# Patient Record
Sex: Female | Born: 1965 | Race: Black or African American | Hispanic: No | Marital: Single
Health system: Southern US, Community
[De-identification: ages and names within clinical notes are randomized; demographics above are authoritative.]

## PROBLEM LIST (undated history)

## (undated) DIAGNOSIS — N950 Postmenopausal bleeding: Secondary | ICD-10-CM

## (undated) DIAGNOSIS — Z973 Presence of spectacles and contact lenses: Secondary | ICD-10-CM

## (undated) DIAGNOSIS — N8500 Endometrial hyperplasia, unspecified: Secondary | ICD-10-CM

## (undated) DIAGNOSIS — M1990 Unspecified osteoarthritis, unspecified site: Secondary | ICD-10-CM

## (undated) DIAGNOSIS — I10 Essential (primary) hypertension: Secondary | ICD-10-CM

## (undated) HISTORY — PX: NO PAST SURGERIES: SHX2092

---

## 2019-07-20 ENCOUNTER — Ambulatory Visit: Payer: 59 | Attending: Internal Medicine

## 2019-07-20 DIAGNOSIS — Z23 Encounter for immunization: Secondary | ICD-10-CM

## 2019-07-20 NOTE — Progress Notes (Signed)
   Covid-19 Vaccination Clinic  Name:  Leah Hayes    MRN: [id number] DOB: [birth year]  07/20/2019  [name] was observed post Covid-19 immunization for 15 minutes without incident. She was provided with Vaccine Information Sheet and instruction to access the V-Safe system.   [name] was instructed to call 911 with any severe reactions post vaccine: Marland Kitchen Difficulty breathing  . Swelling of face and throat  . A fast heartbeat  . A bad rash all over body  . Dizziness and weakness   Immunizations Administered    Name Date Dose VIS Date Route   Pfizer COVID-19 Vaccine 07/20/2019  6:42 PM 0.3 mL 04/27/2019 Intramuscular   Manufacturer: [place]   Lot: [id number]   [place]: [id number]

## 2019-08-15 ENCOUNTER — Ambulatory Visit: Payer: 59 | Attending: Internal Medicine

## 2019-08-15 DIAGNOSIS — Z23 Encounter for immunization: Secondary | ICD-10-CM

## 2019-08-15 NOTE — Progress Notes (Signed)
   Covid-19 Vaccination Clinic  Name:  Leah Hayes    MRN: [id number] DOB: 05-Mar-1966  08/15/2019  [name] was observed post Covid-19 immunization for 15 minutes without incident. She was provided with Vaccine Information Sheet and instruction to access the V-Safe system.   [name] was instructed to call 911 with any severe reactions post vaccine: Marland Kitchen Difficulty breathing  . Swelling of face and throat  . A fast heartbeat  . A bad rash all over body  . Dizziness and weakness   Immunizations Administered    Name Date Dose VIS Date Route   Pfizer COVID-19 Vaccine 08/15/2019  9:13 AM 0.3 mL 04/27/2019 Intramuscular   Manufacturer: [place]   Lot: [id number]   [place]: [id number]

## 2019-08-20 ENCOUNTER — Ambulatory Visit: Payer: 59

## 2019-09-18 ENCOUNTER — Other Ambulatory Visit: Payer: Self-pay | Admitting: Family Medicine

## 2019-09-18 DIAGNOSIS — Z1231 Encounter for screening mammogram for malignant neoplasm of breast: Secondary | ICD-10-CM

## 2019-09-28 ENCOUNTER — Other Ambulatory Visit: Payer: Self-pay

## 2019-09-28 ENCOUNTER — Ambulatory Visit
Admission: RE | Admit: 2019-09-28 | Discharge: 2019-09-28 | Disposition: A | Discharge status: Home / Self Care | Payer: 59 | Source: Ambulatory Visit | Attending: Family Medicine | Admitting: Family Medicine

## 2019-09-28 DIAGNOSIS — Z1231 Encounter for screening mammogram for malignant neoplasm of breast: Secondary | ICD-10-CM

## 2019-10-05 ENCOUNTER — Other Ambulatory Visit: Payer: Self-pay | Admitting: Family Medicine

## 2019-10-05 DIAGNOSIS — R928 Other abnormal and inconclusive findings on diagnostic imaging of breast: Secondary | ICD-10-CM

## 2019-10-09 ENCOUNTER — Other Ambulatory Visit: Payer: 59

## 2020-08-28 ENCOUNTER — Other Ambulatory Visit: Payer: Self-pay | Admitting: Family Medicine

## 2020-08-28 DIAGNOSIS — Z1231 Encounter for screening mammogram for malignant neoplasm of breast: Secondary | ICD-10-CM

## 2020-09-12 NOTE — H&P (Signed)
Leah Hayes is an 55 y.o. G0 who is admitted for Robotic Assisted Total Laparoscopic Hysterectomy with Bilateral Salpingo-oophorectomy for endometrial hyperplasia without atypia.  Patient had an episode of PMB (spotting with wiping) in September 2021. Menopause occurred at age 29. Since her initial evaluation in October, has had occasional dark brown spotting. After extensive discussion reviewing risks/benefits/alternatives to medical vs definitive surgical management, patient opted to definitively manage via hysterectomy. She agrees with bilateral oophorectomy as prophylaxis as well. She was started on PO progestins 06/2020 while awaiting surgery.  Work-up: EMB (03/20/2021): Endometrial hyperplasia without atypia Pelvic US (03/20/21): Uterus 8.02 x 4.64 x 4.90cm, Endometrial thickness 1.03cm, R ovary 2.57cm, L ovary 2.86cm. Anteverted uterus. Uterus difficult to clearly visualize and evaluate. Endometrium appears thickened - no blood flow noted. Cervix - simple cyst 2.6 x 2.4 x 2.1cm, questionable Nabothian cyst. Bilateral ovaries wnl. Bowel gas noted throughout pelvis. Scanned both transvaginally and transabdominally - left ovary only visualized transabdominally. Pap (2018):  Normal, previous pap smears in the last 20 years normal  Patient Active Problem List   Diagnosis Date Noted  . Endometrial hyperplasia without atypia 09/15/2020  . Hypertension 09/15/2020  . Postmenopausal bleeding 09/15/2020  . Morbid obesity with BMI of 40.0-44.9, adult ([place]) 09/15/2020    MEDICAL/FAMILY/SOCIAL HX: No LMP recorded.    Past Medical History:  Diagnosis Date  . Endometrial hyperplasia without atypia   . Hypertension   . Morbid obesity with BMI of 40.0-44.9, adult [facility])     History reviewed. No pertinent surgical history.  No family history on file.  Social History:  has no history on file for tobacco use, alcohol use, and drug use.  ALLERGIES/MEDS:  Allergies: Not on File  No medications  prior to admission.     Review of Systems  Constitutional: Negative.   HENT: Negative.   Eyes: Negative.   Respiratory: Negative.   Cardiovascular: Negative.   Gastrointestinal: Negative.   Genitourinary: Negative.   Musculoskeletal: Negative.   Skin: Negative.   Neurological: Negative.   Endo/Heme/Allergies: Negative.   Psychiatric/Behavioral: Negative.     There were no vitals taken for this visit. Gen:  NAD, pleasant and cooperative Cardio:  RRR Lungs:  CTAB, no wheezes/rales/rhonchi Abd: Soft, non-distended, non-tender Ext:  No bilateral LE edema Pelvic (03/20/20): labia - unremarkable, vagina - pink moist mucosa, no lesions or abnormal discharge, cervix - no discharge or lesions or CMT, adnexa - no masses or tenderness, uterus - nontender and normal size on palpation  No results found for this or any previous visit (from the past 24 hour(s)).  No results found.   ASSESSMENT/PLAN: Leah Hayes is a 55 y.o. G0 who is admitted for Robotic Assisted Total Laparoscopic Hysterectomy with Bilateral Salpingo-oophorectomy for endometrial hyperplasia without atypia.  - Admit to [facility] - Admit labs/imaging:  CBC, BMP, CXR, EKG, COVID-19 screen - Diet:  NPO - IVF:  Per anesthesia - VTE Prophylaxis:  SCDs - Antibiotics: Ancef 2g on call to OR - Anticipate D/C home on POD#1  Consents: I have explained to the patient that his surgery is performed to remove the uterus through several small incisions in the abdomen and that it will result in sterility.  I discussed the risks and benefits of the surgery, including, but not limited to bleeding, including the need for blood transfusion, infection, damage to surround organs and tissues, damage to bladder, damage to ureters, causing kidney damage, and requiring additional procedures, damage to bowels, resulting in further surgery, postoperative pain, short-term and  long-term, scarring on the abdominal wall and intra-abdominally, need for  further surgery, need for conversion to an open procedure, development of an incisional hernia, deep vein thrombosis, and/or pulmonary embolism, wound infection and/or separation, painful intercourse, urinary leakage, ovarian failure, resulting in menopausal symptoms requiring treatment, fistula formation, complications the course of which cannot be predicted or prevented, and death. Patient was counseled on prophylactic oophorectomy versus ovarian conservation, understanding that ovarian conservation carries a lifetime risk of ovarian cancer of 1 in 72 and a 5-10% risk of reoperation in the future for ovarian pathology.  The patient opts for oophorectomy. Patient was consented for blood products.  The patient is aware that bleeding may result in the need for a blood transfusion which includes risk of transmission of HIV (1:2 million), Hepatitis C (1:2 million), and Hepatitis B (1:200 thousand) and transfusion reaction.  Patient voiced understanding of the above risks as well as understanding of indications for blood transfusion.  [name], DO [phone] (office)

## 2020-09-15 ENCOUNTER — Encounter (HOSPITAL_BASED_OUTPATIENT_CLINIC_OR_DEPARTMENT_OTHER): Payer: Self-pay | Admitting: Obstetrics and Gynecology

## 2020-09-15 DIAGNOSIS — N950 Postmenopausal bleeding: Secondary | ICD-10-CM

## 2020-09-15 DIAGNOSIS — I10 Essential (primary) hypertension: Secondary | ICD-10-CM

## 2020-09-15 DIAGNOSIS — N8500 Endometrial hyperplasia, unspecified: Secondary | ICD-10-CM

## 2020-09-15 DIAGNOSIS — Z6841 Body Mass Index (BMI) 40.0 and over, adult: Secondary | ICD-10-CM

## 2020-09-16 ENCOUNTER — Encounter (HOSPITAL_BASED_OUTPATIENT_CLINIC_OR_DEPARTMENT_OTHER): Payer: Self-pay | Admitting: Obstetrics and Gynecology

## 2020-09-16 ENCOUNTER — Other Ambulatory Visit: Payer: Self-pay

## 2020-09-16 NOTE — Progress Notes (Signed)
YOU ARE SCHEDULED FOR A COVID TEST 09-22-2020 at 1100 am.THIS TEST MUST BE DONE BEFORE SURGERY. GO TO  [place] [place], [place], IT IS APPROXIMATELY 2 MINUTES PAST [employer] ON THE RIGHT AND REMAIN IN YOUR CAR, THIS IS A DRIVE UP TEST. ONCE YOUR COVID TEST IS DONE PLEASE FOLLOW ALL THE QUARANTINE  INSTRUCTIONS GIVEN IN YOUR HANDOUT.      Your procedure is scheduled on  5-11-20220  Report to [facility].   Call this number if you have problems the morning of surgery  :[phone].   OUR ADDRESS IS [address].  WE ARE LOCATED IN [facility].  PLEASE BRING YOUR INSURANCE CARD AND PHOTO ID DAY OF SURGERY.  ONLY ONE PERSON ALLOWED IN FACILITY WAITING AREA.                                     REMEMBER:  DO NOT EAT FOOD, CANDY GUM OR MINTS  AFTER MIDNIGHT . YOU MAY HAVE CLEAR LIQUIDS FROM MIDNIGHT UNTIL 530 am.  530 am. NO CLEAR LIQUIDS AFTER 530 am DAY OF SURGERY.   YOU MAY  BRUSH YOUR TEETH MORNING OF SURGERY AND RINSE YOUR MOUTH OUT, NO CHEWING GUM CANDY OR MINTS.    CLEAR LIQUID DIET   Foods Allowed                                                                     Foods Excluded  Coffee and tea, regular and decaf                             liquids that you cannot  Plain Jell-O any favor except red or purple                                           see through such as: Fruit ices (not with fruit pulp)                                     milk, soups, orange juice  Iced Popsicles                                    All solid food Carbonated beverages, regular and diet                                    Cranberry, grape and apple juices Sports drinks like Gatorade Lightly seasoned clear broth or consume(fat free) Sugar, honey syrup  Sample Menu Breakfast                                Lunch  Supper Cranberry juice                    Beef broth                             Chicken broth Jell-O                                     Grape juice                           Apple juice Coffee or tea                        Jell-O                                      Popsicle                                                Coffee or tea                        Coffee or tea  _____________________________________________________________________     TAKE THESE MEDICATIONS MORNING OF SURGERY WITH A SIP OF WATER:  NONE  ONE VISITOR IS ALLOWED IN WAITING ROOM ONLY DAY OF SURGERY.  NO VISITOR MAY SPEND THE NIGHT.  VISITOR ARE ALLOWED TO STAY UNTIL 800 PM.                                    DO NOT WEAR JEWERLY, MAKE UP. DO NOT WEAR LOTIONS, POWDERS, PERFUMES OR DEODORANT. DO NOT SHAVE FOR 24 HOURS PRIOR TO DAY OF SURGERY. MEN MAY SHAVE FACE AND NECK. CONTACTS, GLASSES, OR DENTURES MAY NOT BE WORN TO SURGERY.                                    [facility] IS NOT RESPONSIBLE  FOR ANY BELONGINGS.                                                                    Marland Kitchen           [place] - Preparing for Surgery Before surgery, you can play an important role.  Because skin is not sterile, your skin needs to be as free of germs as possible.  You can reduce the number of germs on your skin by washing with CHG (chlorahexidine gluconate) soap before surgery.  CHG is an antiseptic cleaner which kills germs and bonds with the skin to continue killing germs even after washing. Please DO NOT use if you have an allergy to CHG or antibacterial soaps.  If your skin becomes reddened/irritated stop  using the CHG and inform your nurse when you arrive at Short Stay. Do not shave (including legs and underarms) for at least 48 hours prior to the first CHG shower.  You may shave your face/neck. Please follow these instructions carefully:  1.  Shower with CHG Soap the night before surgery and the  morning of Surgery.  2.  If you choose to wash your hair, wash your hair first as usual with your  normal   shampoo.  3.  After you shampoo, rinse your hair and body thoroughly to remove the  shampoo.                            4.  Use CHG as you would any other liquid soap.  You can apply chg directly  to the skin and wash                      Gently with a scrungie or clean washcloth.  5.  Apply the CHG Soap to your body ONLY FROM THE NECK DOWN.   Do not use on face/ open                           Wound or open sores. Avoid contact with eyes, ears mouth and genitals (private parts).                       Wash face,  Genitals (private parts) with your normal soap.             6.  Wash thoroughly, paying special attention to the area where your surgery  will be performed.  7.  Thoroughly rinse your body with warm water from the neck down.  8.  DO NOT shower/wash with your normal soap after using and rinsing off  the CHG Soap.                9.  Pat yourself dry with a clean towel.            10.  Wear clean pajamas.            11.  Place clean sheets on your bed the night of your first shower and do not  sleep with pets. Day of Surgery : Do not apply any lotions/deodorants the morning of surgery.  Please wear clean clothes to the hospital/surgery center.  FAILURE TO FOLLOW THESE INSTRUCTIONS MAY RESULT IN THE CANCELLATION OF YOUR SURGERY PATIENT SIGNATURE_________________________________  NURSE SIGNATURE__________________________________  ________________________________________________________________________                                                        QUESTIONS Hansel Feinstein PRE OP NURSE PHONE [phone]

## 2020-09-16 NOTE — Progress Notes (Signed)
Spoke w/ via phone for pre-op interview---pt Lab needs dos---- none  Has lab appt 09-22-2020 900  Am for cbc bmp ekg chest xray              --COVID test ------ 09-22-2020 1100 Arrive at -------630 am 09-24-2020 NPO after MN NO Solid Food.  Clear liquids from MN until---530 am then npo Med rec completed Medications to take morning of surgery -----none Diabetic medication -----n/a Patient instructed to bring photo id and insurance card day of surgery Patient aware to have Driver (ride ) / caregiver driver mother Bethena Roys will stay  for 24 hours after surgery  Patient Special Instructions -----pt given overnight stay instructions Pre-Op special Istructions -----none Patient verbalized understanding of instructions that were given at this phone interview. Patient denies shortness of breath, chest pain, fever, cough at this phone interview.

## 2020-09-17 DIAGNOSIS — Z1231 Encounter for screening mammogram for malignant neoplasm of breast: Secondary | ICD-10-CM

## 2020-09-22 ENCOUNTER — Encounter (HOSPITAL_COMMUNITY)
Admission: RE | Admit: 2020-09-22 | Discharge: 2020-09-22 | Disposition: A | Discharge status: Home / Self Care | Payer: 59 | Source: Ambulatory Visit | Attending: Obstetrics and Gynecology | Admitting: Obstetrics and Gynecology

## 2020-09-22 ENCOUNTER — Ambulatory Visit (HOSPITAL_COMMUNITY)
Admission: RE | Admit: 2020-09-22 | Discharge: 2020-09-22 | Disposition: A | Discharge status: Home / Self Care | Payer: 59 | Source: Ambulatory Visit | Attending: Obstetrics and Gynecology | Admitting: Obstetrics and Gynecology

## 2020-09-22 ENCOUNTER — Other Ambulatory Visit (HOSPITAL_COMMUNITY)
Admission: RE | Admit: 2020-09-22 | Discharge: 2020-09-22 | Disposition: A | Discharge status: Home / Self Care | Payer: 59 | Source: Ambulatory Visit | Attending: Obstetrics and Gynecology | Admitting: Obstetrics and Gynecology

## 2020-09-22 ENCOUNTER — Encounter (HOSPITAL_BASED_OUTPATIENT_CLINIC_OR_DEPARTMENT_OTHER): Payer: Self-pay | Admitting: Obstetrics and Gynecology

## 2020-09-22 ENCOUNTER — Other Ambulatory Visit: Payer: Self-pay

## 2020-09-22 DIAGNOSIS — Z01812 Encounter for preprocedural laboratory examination: Secondary | ICD-10-CM | POA: Insufficient documentation

## 2020-09-22 DIAGNOSIS — Z01810 Encounter for preprocedural cardiovascular examination: Secondary | ICD-10-CM | POA: Diagnosis present

## 2020-09-22 DIAGNOSIS — Z20822 Contact with and (suspected) exposure to covid-19: Secondary | ICD-10-CM | POA: Insufficient documentation

## 2020-09-22 LAB — CBC
HCT: 41.5 % (ref 36.0–46.0)
Hemoglobin: 13.8 g/dL (ref 12.0–15.0)
MCH: 30.4 pg (ref 26.0–34.0)
MCHC: 33.3 g/dL (ref 30.0–36.0)
MCV: 91.4 fL (ref 80.0–100.0)
Platelets: 267 10*3/uL (ref 150–400)
RBC: 4.54 MIL/uL (ref 3.87–5.11)
RDW: 12.8 % (ref 11.5–15.5)
WBC: 6.9 10*3/uL (ref 4.0–10.5)
nRBC: 0 % (ref 0.0–0.2)

## 2020-09-22 LAB — BASIC METABOLIC PANEL
Anion gap: 8 (ref 5–15)
BUN: 20 mg/dL (ref 6–20)
CO2: 25 mmol/L (ref 22–32)
Calcium: 9.7 mg/dL (ref 8.9–10.3)
Chloride: 107 mmol/L (ref 98–111)
Creatinine, Ser: 0.95 mg/dL (ref 0.44–1.00)
GFR, Estimated: >60 mL/min (ref >60)
Glucose, Bld: 101 mg/dL — ABNORMAL HIGH (ref 70–99)
Potassium: 3.7 mmol/L (ref 3.5–5.1)
Sodium: 140 mmol/L (ref 135–145)

## 2020-09-22 LAB — SARS CORONAVIRUS 2 (TAT 6-24 HRS): SARS Coronavirus 2: NEGATIVE

## 2020-09-23 NOTE — Anesthesia Preprocedure Evaluation (Addendum)
Anesthesia Evaluation  Patient identified by MRN, date of birth, ID band Patient awake    Reviewed: Allergy & Precautions, NPO status , Patient's Chart, lab work & pertinent test results  Airway Mallampati: II  TM Distance: >3 FB Neck ROM: Full    Dental no notable dental hx.    Pulmonary neg pulmonary ROS,    Pulmonary exam normal breath sounds clear to auscultation       Cardiovascular Exercise Tolerance: Good hypertension, Normal cardiovascular exam Rhythm:Regular Rate:Normal     Neuro/Psych negative neurological ROS  negative psych ROS   GI/Hepatic negative GI ROS, Neg liver ROS,   Endo/Other  Obesity BMI 39   Renal/GU negative Renal ROS  negative genitourinary   Musculoskeletal  (+) Arthritis , Osteoarthritis,    Abdominal   Peds negative pediatric ROS (+)  Hematology   Anesthesia Other Findings Endometrial hyperplasia   Reproductive/Obstetrics                            Anesthesia Physical Anesthesia Plan  ASA: II  Anesthesia Plan: General   Post-op Pain Management:    Induction: Intravenous  PONV Risk Score and Plan: 3 and Scopolamine patch - Pre-op, Treatment may vary due to age or medical condition, Midazolam, Dexamethasone and Ondansetron  Airway Management Planned: Oral ETT  Additional Equipment:   Intra-op Plan:   Post-operative Plan: Extubation in OR  Informed Consent: I have reviewed the patients History and Physical, chart, labs and discussed the procedure including the risks, benefits and alternatives for the proposed anesthesia with the patient or authorized representative who has indicated his/her understanding and acceptance.     Dental advisory given  Plan Discussed with: Anesthesiologist and CRNA  Anesthesia Plan Comments:        Anesthesia Quick Evaluation

## 2020-09-24 ENCOUNTER — Other Ambulatory Visit: Payer: Self-pay

## 2020-09-24 ENCOUNTER — Encounter (HOSPITAL_BASED_OUTPATIENT_CLINIC_OR_DEPARTMENT_OTHER): Payer: Self-pay | Admitting: Obstetrics and Gynecology

## 2020-09-24 ENCOUNTER — Ambulatory Visit (HOSPITAL_BASED_OUTPATIENT_CLINIC_OR_DEPARTMENT_OTHER): Payer: 59 | Admitting: Anesthesiology

## 2020-09-24 ENCOUNTER — Encounter (HOSPITAL_BASED_OUTPATIENT_CLINIC_OR_DEPARTMENT_OTHER)
Admission: RE | Disposition: A | Discharge status: Home / Self Care | Payer: Self-pay | Source: Home / Self Care | Attending: Obstetrics and Gynecology

## 2020-09-24 ENCOUNTER — Ambulatory Visit (HOSPITAL_BASED_OUTPATIENT_CLINIC_OR_DEPARTMENT_OTHER)
Admission: RE | Admit: 2020-09-24 | Discharge: 2020-09-25 | Disposition: A | Discharge status: Home / Self Care | Payer: 59 | Attending: Obstetrics and Gynecology | Admitting: Obstetrics and Gynecology

## 2020-09-24 DIAGNOSIS — Z6839 Body mass index (BMI) 39.0-39.9, adult: Secondary | ICD-10-CM | POA: Insufficient documentation

## 2020-09-24 DIAGNOSIS — I10 Essential (primary) hypertension: Secondary | ICD-10-CM

## 2020-09-24 DIAGNOSIS — E669 Obesity, unspecified: Secondary | ICD-10-CM | POA: Insufficient documentation

## 2020-09-24 DIAGNOSIS — N950 Postmenopausal bleeding: Secondary | ICD-10-CM

## 2020-09-24 DIAGNOSIS — D3912 Neoplasm of uncertain behavior of left ovary: Secondary | ICD-10-CM | POA: Diagnosis not present

## 2020-09-24 DIAGNOSIS — C801 Malignant (primary) neoplasm, unspecified: Secondary | ICD-10-CM

## 2020-09-24 DIAGNOSIS — N840 Polyp of corpus uteri: Secondary | ICD-10-CM | POA: Insufficient documentation

## 2020-09-24 DIAGNOSIS — N8500 Endometrial hyperplasia, unspecified: Secondary | ICD-10-CM

## 2020-09-24 DIAGNOSIS — N800 Endometriosis of uterus: Secondary | ICD-10-CM | POA: Insufficient documentation

## 2020-09-24 DIAGNOSIS — N888 Other specified noninflammatory disorders of cervix uteri: Secondary | ICD-10-CM | POA: Insufficient documentation

## 2020-09-24 HISTORY — DX: Unspecified osteoarthritis, unspecified site: M19.90

## 2020-09-24 HISTORY — DX: Presence of spectacles and contact lenses: Z97.3

## 2020-09-24 HISTORY — DX: Endometrial hyperplasia, unspecified: N85.00

## 2020-09-24 HISTORY — DX: Essential (primary) hypertension: I10

## 2020-09-24 HISTORY — DX: Morbid (severe) obesity due to excess calories: E66.01

## 2020-09-24 HISTORY — DX: Postmenopausal bleeding: N95.0

## 2020-09-24 HISTORY — DX: Malignant (primary) neoplasm, unspecified: C80.1

## 2020-09-24 HISTORY — PX: ROBOTIC ASSISTED LAPAROSCOPIC HYSTERECTOMY AND SALPINGECTOMY: SHX6379

## 2020-09-24 LAB — TYPE AND SCREEN
ABO/RH(D): O POS
Antibody Screen: NEGATIVE

## 2020-09-24 LAB — ABO/RH: ABO/RH(D): O POS

## 2020-09-24 SURGERY — XI ROBOTIC ASSISTED LAPAROSCOPIC HYSTERECTOMY AND SALPINGECTOMY
Anesthesia: General | Laterality: Bilateral

## 2020-09-24 MED ORDER — MIDAZOLAM HCL 2 MG/2ML IJ SOLN
INTRAMUSCULAR | Status: AC
  Filled 2020-09-24: qty 2

## 2020-09-24 MED ORDER — LABETALOL HCL 5 MG/ML IV SOLN: 20.0000 mg | Freq: Once | INTRAVENOUS | Status: DC

## 2020-09-24 MED ORDER — PHENYLEPHRINE HCL (PRESSORS) 10 MG/ML IV SOLN
INTRAVENOUS | Status: DC | PRN
  Administered 2020-09-24 (×3): 80 ug via INTRAVENOUS

## 2020-09-24 MED ORDER — FENTANYL CITRATE (PF) 250 MCG/5ML IJ SOLN
INTRAMUSCULAR | Status: AC
  Filled 2020-09-24: qty 5

## 2020-09-24 MED ORDER — PROPOFOL 10 MG/ML IV BOLUS
INTRAVENOUS | Status: DC | PRN
  Administered 2020-09-24: 50 mg via INTRAVENOUS
  Administered 2020-09-24: 40 mg via INTRAVENOUS
  Administered 2020-09-24: 50 mg via INTRAVENOUS
  Administered 2020-09-24: 200 mg via INTRAVENOUS

## 2020-09-24 MED ORDER — PROPOFOL 10 MG/ML IV BOLUS
INTRAVENOUS | Status: AC
  Filled 2020-09-24: qty 40

## 2020-09-24 MED ORDER — CEFAZOLIN SODIUM-DEXTROSE 2-4 GM/100ML-% IV SOLN
INTRAVENOUS | Status: AC
  Filled 2020-09-24: qty 100

## 2020-09-24 MED ORDER — LIDOCAINE HCL (PF) 2 % IJ SOLN
INTRAMUSCULAR | Status: DC | PRN
  Administered 2020-09-24: 1.5 mg/kg/h via INTRADERMAL

## 2020-09-24 MED ORDER — OXYCODONE HCL 5 MG PO TABS: 5.0000 mg | ORAL_TABLET | Freq: Once | ORAL | Status: DC | PRN

## 2020-09-24 MED ORDER — DEXAMETHASONE SODIUM PHOSPHATE 4 MG/ML IJ SOLN
INTRAMUSCULAR | Status: DC | PRN
  Administered 2020-09-24: 8 mg via INTRAVENOUS

## 2020-09-24 MED ORDER — IBUPROFEN 800 MG PO TABS
ORAL_TABLET | ORAL | Status: AC
  Filled 2020-09-24: qty 1

## 2020-09-24 MED ORDER — SODIUM CHLORIDE 0.9 % IV SOLN
INTRAVENOUS | Status: DC | PRN
  Administered 2020-09-24: 110 mL

## 2020-09-24 MED ORDER — LIDOCAINE HCL (CARDIAC) PF 100 MG/5ML IV SOSY
PREFILLED_SYRINGE | INTRAVENOUS | Status: DC | PRN
  Administered 2020-09-24: 80 mg via INTRAVENOUS

## 2020-09-24 MED ORDER — OXYCODONE HCL 5 MG PO TABS
5.0000 mg | ORAL_TABLET | ORAL | Status: DC | PRN
  Administered 2020-09-24: 10 mg via ORAL
  Administered 2020-09-24: 5 mg via ORAL
  Administered 2020-09-25 (×2): 10 mg via ORAL

## 2020-09-24 MED ORDER — ONDANSETRON HCL 4 MG PO TABS: 4.0000 mg | ORAL_TABLET | Freq: Four times a day (QID) | ORAL | Status: DC | PRN

## 2020-09-24 MED ORDER — LACTATED RINGERS IV SOLN: INTRAVENOUS | Status: DC

## 2020-09-24 MED ORDER — LIDOCAINE 2% (20 MG/ML) 5 ML SYRINGE
INTRAMUSCULAR | Status: AC
  Filled 2020-09-24: qty 10

## 2020-09-24 MED ORDER — SUGAMMADEX SODIUM 200 MG/2ML IV SOLN
INTRAVENOUS | Status: DC | PRN
  Administered 2020-09-24: 230 mg via INTRAVENOUS

## 2020-09-24 MED ORDER — SCOPOLAMINE 1 MG/3DAYS TD PT72
MEDICATED_PATCH | TRANSDERMAL | Status: AC
  Filled 2020-09-24: qty 1

## 2020-09-24 MED ORDER — OXYCODONE HCL 5 MG/5ML PO SOLN: 5.0000 mg | Freq: Once | ORAL | Status: DC | PRN

## 2020-09-24 MED ORDER — CEFAZOLIN SODIUM-DEXTROSE 2-4 GM/100ML-% IV SOLN
2.0000 g | INTRAVENOUS | Status: AC
  Administered 2020-09-24: 2 g via INTRAVENOUS

## 2020-09-24 MED ORDER — ACETAMINOPHEN 500 MG PO TABS
ORAL_TABLET | ORAL | Status: AC
  Filled 2020-09-24: qty 2

## 2020-09-24 MED ORDER — KETOROLAC TROMETHAMINE 30 MG/ML IJ SOLN
INTRAMUSCULAR | Status: AC
  Filled 2020-09-24: qty 1

## 2020-09-24 MED ORDER — SIMETHICONE 80 MG PO CHEW: 80.0000 mg | CHEWABLE_TABLET | Freq: Four times a day (QID) | ORAL | Status: DC | PRN

## 2020-09-24 MED ORDER — DROPERIDOL 2.5 MG/ML IJ SOLN
0.6250 mg | Freq: Once | INTRAMUSCULAR | Status: DC | PRN
Start: 2020-09-24 — End: 2020-09-24

## 2020-09-24 MED ORDER — PANTOPRAZOLE SODIUM 40 MG IV SOLR
40.0000 mg | Freq: Every day | INTRAVENOUS | Status: DC
  Administered 2020-09-24: 40 mg via INTRAVENOUS

## 2020-09-24 MED ORDER — IBUPROFEN 800 MG PO TABS
800.0000 mg | ORAL_TABLET | Freq: Three times a day (TID) | ORAL | Status: DC
  Administered 2020-09-24 – 2020-09-25 (×2): 800 mg via ORAL

## 2020-09-24 MED ORDER — MORPHINE SULFATE (PF) 4 MG/ML IV SOLN: 1.0000 mg | INTRAVENOUS | Status: DC | PRN

## 2020-09-24 MED ORDER — KETAMINE HCL 10 MG/ML IJ SOLN
INTRAMUSCULAR | Status: DC | PRN
  Administered 2020-09-24: 30 mg via INTRAVENOUS

## 2020-09-24 MED ORDER — OXYCODONE HCL 5 MG PO TABS
ORAL_TABLET | ORAL | Status: AC
  Filled 2020-09-24: qty 2

## 2020-09-24 MED ORDER — PROMETHAZINE HCL 25 MG/ML IJ SOLN: 6.2500 mg | INTRAMUSCULAR | Status: DC | PRN

## 2020-09-24 MED ORDER — ACETAMINOPHEN 500 MG PO TABS
1000.0000 mg | ORAL_TABLET | Freq: Once | ORAL | Status: AC
  Administered 2020-09-24: 1000 mg via ORAL

## 2020-09-24 MED ORDER — LIDOCAINE 2% (20 MG/ML) 5 ML SYRINGE
INTRAMUSCULAR | Status: AC
  Filled 2020-09-24: qty 15

## 2020-09-24 MED ORDER — OXYCODONE HCL 5 MG PO TABS
ORAL_TABLET | ORAL | Status: AC
  Filled 2020-09-24: qty 1

## 2020-09-24 MED ORDER — DEXMEDETOMIDINE (PRECEDEX) IN NS 20 MCG/5ML (4 MCG/ML) IV SYRINGE
PREFILLED_SYRINGE | INTRAVENOUS | Status: AC
  Filled 2020-09-24: qty 5

## 2020-09-24 MED ORDER — SODIUM CHLORIDE 0.9 % IR SOLN
Status: DC | PRN
  Administered 2020-09-24 (×2): 1000 mL

## 2020-09-24 MED ORDER — ONDANSETRON HCL 4 MG/2ML IJ SOLN: 4.0000 mg | Freq: Four times a day (QID) | INTRAMUSCULAR | Status: DC | PRN

## 2020-09-24 MED ORDER — ONDANSETRON HCL 4 MG/2ML IJ SOLN
INTRAMUSCULAR | Status: DC | PRN
  Administered 2020-09-24: 4 mg via INTRAVENOUS

## 2020-09-24 MED ORDER — ROCURONIUM BROMIDE 10 MG/ML (PF) SYRINGE
PREFILLED_SYRINGE | INTRAVENOUS | Status: AC
  Filled 2020-09-24: qty 10

## 2020-09-24 MED ORDER — ROCURONIUM BROMIDE 100 MG/10ML IV SOLN
INTRAVENOUS | Status: DC | PRN
  Administered 2020-09-24 (×2): 10 mg via INTRAVENOUS
  Administered 2020-09-24: 5 mg via INTRAVENOUS
  Administered 2020-09-24: 90 mg via INTRAVENOUS

## 2020-09-24 MED ORDER — DEXMEDETOMIDINE (PRECEDEX) IN NS 20 MCG/5ML (4 MCG/ML) IV SYRINGE
PREFILLED_SYRINGE | INTRAVENOUS | Status: DC | PRN
  Administered 2020-09-24: 4 ug via INTRAVENOUS

## 2020-09-24 MED ORDER — ACETAMINOPHEN 500 MG PO TABS: 1000.0000 mg | ORAL_TABLET | Freq: Four times a day (QID) | ORAL | Status: DC | PRN

## 2020-09-24 MED ORDER — FENTANYL CITRATE (PF) 100 MCG/2ML IJ SOLN
INTRAMUSCULAR | Status: DC | PRN
  Administered 2020-09-24: 100 ug via INTRAVENOUS
  Administered 2020-09-24 (×3): 50 ug via INTRAVENOUS

## 2020-09-24 MED ORDER — FENTANYL CITRATE (PF) 100 MCG/2ML IJ SOLN
INTRAMUSCULAR | Status: AC
  Filled 2020-09-24: qty 2

## 2020-09-24 MED ORDER — DEXAMETHASONE SODIUM PHOSPHATE 10 MG/ML IJ SOLN
INTRAMUSCULAR | Status: AC
  Filled 2020-09-24: qty 1

## 2020-09-24 MED ORDER — KETAMINE HCL 50 MG/5ML IJ SOSY
PREFILLED_SYRINGE | INTRAMUSCULAR | Status: AC
  Filled 2020-09-24: qty 5

## 2020-09-24 MED ORDER — KETOROLAC TROMETHAMINE 30 MG/ML IJ SOLN
INTRAMUSCULAR | Status: DC | PRN
  Administered 2020-09-24: 30 mg via INTRAVENOUS

## 2020-09-24 MED ORDER — GLYCOPYRROLATE 0.2 MG/ML IJ SOLN
INTRAMUSCULAR | Status: DC | PRN
  Administered 2020-09-24 (×2): .1 mg via INTRAVENOUS

## 2020-09-24 MED ORDER — PANTOPRAZOLE SODIUM 40 MG IV SOLR
INTRAVENOUS | Status: AC
  Filled 2020-09-24: qty 40

## 2020-09-24 MED ORDER — SODIUM CHLORIDE (PF) 0.9 % IJ SOLN
INTRAMUSCULAR | Status: AC
  Filled 2020-09-24: qty 20

## 2020-09-24 MED ORDER — SCOPOLAMINE 1 MG/3DAYS TD PT72
1.0000 | MEDICATED_PATCH | TRANSDERMAL | Status: DC
  Administered 2020-09-24: 1.5 mg via TRANSDERMAL

## 2020-09-24 MED ORDER — FENTANYL CITRATE (PF) 100 MCG/2ML IJ SOLN: 25.0000 ug | INTRAMUSCULAR | Status: DC | PRN

## 2020-09-24 MED ORDER — MIDAZOLAM HCL 2 MG/2ML IJ SOLN
INTRAMUSCULAR | Status: DC | PRN
  Administered 2020-09-24: 1 mg via INTRAVENOUS

## 2020-09-24 MED ORDER — ONDANSETRON HCL 4 MG/2ML IJ SOLN
INTRAMUSCULAR | Status: AC
  Filled 2020-09-24: qty 2

## 2020-09-24 SURGICAL SUPPLY — 77 items
ADH SKN CLS APL DERMABOND .7 (GAUZE/BANDAGES/DRESSINGS) ×1
APL ESCP 73.6OZ SRGCL (TIP) ×1
APL SRG 38 LTWT LNG FL B (MISCELLANEOUS)
APPLICATOR ARISTA FLEXITIP XL (MISCELLANEOUS) IMPLANT
BARRIER ADHS 3X4 INTERCEED (GAUZE/BANDAGES/DRESSINGS) IMPLANT
BRR ADH 4X3 ABS CNTRL BYND (GAUZE/BANDAGES/DRESSINGS)
CATH FOLEY 3WAY  5CC 16FR (CATHETERS) ×1
CATH FOLEY 3WAY 5CC 16FR (CATHETERS) ×1 IMPLANT
CELLS DAT CNTRL 66122 CELL SVR (MISCELLANEOUS) IMPLANT
COVER BACK TABLE 60X90IN (DRAPES) ×2 IMPLANT
COVER TIP SHEARS 8 DVNC (MISCELLANEOUS) ×1 IMPLANT
COVER TIP SHEARS 8MM DA VINCI (MISCELLANEOUS) ×1
DECANTER SPIKE VIAL GLASS SM (MISCELLANEOUS) IMPLANT
DEFOGGER SCOPE WARMER CLEARIFY (MISCELLANEOUS) ×2 IMPLANT
DERMABOND ADVANCED (GAUZE/BANDAGES/DRESSINGS) ×1
DERMABOND ADVANCED .7 DNX12 (GAUZE/BANDAGES/DRESSINGS) ×1 IMPLANT
DILATOR CANAL MILEX (MISCELLANEOUS) ×2 IMPLANT
DRAPE ARM DVNC X/XI (DISPOSABLE) ×4 IMPLANT
DRAPE COLUMN DVNC XI (DISPOSABLE) ×1 IMPLANT
DRAPE DA VINCI XI ARM (DISPOSABLE) ×4
DRAPE DA VINCI XI COLUMN (DISPOSABLE) ×1
DRAPE UTILITY 15X26 TOWEL STRL (DRAPES) ×2 IMPLANT
DRAPE UTILITY XL STRL (DRAPES) ×2 IMPLANT
DURAPREP 26ML APPLICATOR (WOUND CARE) ×2 IMPLANT
ELECT REM PT RETURN 9FT ADLT (ELECTROSURGICAL) ×2
ELECTRODE REM PT RTRN 9FT ADLT (ELECTROSURGICAL) ×1 IMPLANT
GLOVE SURG ENC TEXT LTX SZ6.5 (GLOVE) ×14 IMPLANT
GLOVE SURG UNDER POLY LF SZ6.5 (GLOVE) ×12 IMPLANT
HEMOSTAT ARISTA ABSORB 3G PWDR (HEMOSTASIS) IMPLANT
HOLDER FOLEY CATH W/STRAP (MISCELLANEOUS) ×2 IMPLANT
IRRIG SUCT STRYKERFLOW 2 WTIP (MISCELLANEOUS) ×2
IRRIGATION SUCT STRKRFLW 2 WTP (MISCELLANEOUS) ×1 IMPLANT
IV NS 1000ML (IV SOLUTION) ×2
IV NS 1000ML BAXH (IV SOLUTION) ×1 IMPLANT
KIT TURNOVER CYSTO (KITS) ×2 IMPLANT
LEGGING LITHOTOMY PAIR STRL (DRAPES) ×2 IMPLANT
NS IRRIG 1000ML POUR BTL (IV SOLUTION) ×2 IMPLANT
OBTURATOR OPTICAL STANDARD 8MM (TROCAR) ×1
OBTURATOR OPTICAL STND 8 DVNC (TROCAR) ×1
OBTURATOR OPTICALSTD 8 DVNC (TROCAR) ×1 IMPLANT
OCCLUDER COLPOPNEUMO (BALLOONS) ×2 IMPLANT
PACK ROBOT WH (CUSTOM PROCEDURE TRAY) ×2 IMPLANT
PACK ROBOTIC GOWN (GOWN DISPOSABLE) ×2 IMPLANT
PACK TRENDGUARD 450 HYBRID PRO (MISCELLANEOUS) ×1 IMPLANT
PAD OB MATERNITY 4.3X12.25 (PERSONAL CARE ITEMS) ×2 IMPLANT
PAD PREP 24X48 CUFFED NSTRL (MISCELLANEOUS) ×2 IMPLANT
PLUG CATH AND CAP STER (CATHETERS) ×2 IMPLANT
POWDER SURGICEL 3.0 GRAM (HEMOSTASIS) ×2 IMPLANT
PROTECTOR NERVE ULNAR (MISCELLANEOUS) ×2 IMPLANT
RTRCTR WOUND ALEXIS 18CM MED (MISCELLANEOUS)
RTRCTR WOUND ALEXIS 18CM SML (INSTRUMENTS)
SAVER CELL AAL HAEMONETICS (INSTRUMENTS) IMPLANT
SCISSORS LAP 5X45 EPIX DISP (ENDOMECHANICALS) IMPLANT
SEAL CANN UNIV 5-8 DVNC XI (MISCELLANEOUS) ×4 IMPLANT
SEAL XI 5MM-8MM UNIVERSAL (MISCELLANEOUS) ×4
SEALER VESSEL DA VINCI XI (MISCELLANEOUS) ×1
SEALER VESSEL EXT DVNC XI (MISCELLANEOUS) ×1 IMPLANT
SET IRRIG Y TYPE TUR BLADDER L (SET/KITS/TRAYS/PACK) IMPLANT
SET TRI-LUMEN FLTR TB AIRSEAL (TUBING) ×2 IMPLANT
SUT VIC AB 0 CT1 27 (SUTURE) ×6
SUT VIC AB 0 CT1 27XBRD ANBCTR (SUTURE) ×3 IMPLANT
SUT VICRYL 0 UR6 27IN ABS (SUTURE) ×2 IMPLANT
SUT VICRYL RAPIDE 4/0 PS 2 (SUTURE) ×6 IMPLANT
SUT VLOC 180 0 9IN  GS21 (SUTURE) ×2
SUT VLOC 180 0 9IN GS21 (SUTURE) ×2 IMPLANT
SYR TOOMEY IRRIG 70ML (MISCELLANEOUS) ×2
SYRINGE TOOMEY IRRIG 70ML (MISCELLANEOUS) ×1 IMPLANT
TIP ENDOSCOPIC SURGICEL (TIP) ×2 IMPLANT
TIP RUMI ORANGE 6.7MMX12CM (TIP) IMPLANT
TIP UTERINE 5.1X6CM LAV DISP (MISCELLANEOUS) IMPLANT
TIP UTERINE 6.7X10CM GRN DISP (MISCELLANEOUS) IMPLANT
TIP UTERINE 6.7X6CM WHT DISP (MISCELLANEOUS) IMPLANT
TIP UTERINE 6.7X8CM BLUE DISP (MISCELLANEOUS) ×2 IMPLANT
TOWEL OR 17X26 10 PK STRL BLUE (TOWEL DISPOSABLE) ×2 IMPLANT
TRENDGUARD 450 HYBRID PRO PACK (MISCELLANEOUS) ×2
TROCAR PORT AIRSEAL 8X120 (TROCAR) ×2 IMPLANT
WATER STERILE IRR 1000ML POUR (IV SOLUTION) ×2 IMPLANT

## 2020-09-24 NOTE — Discharge Instructions (Signed)
DISCHARGE INSTRUCTIONS: Laparoscopy  The following instructions have been prepared to help you care for yourself upon your return home today.  Wound care: Marland Kitchen Do not get the incision wet for the first 24 hours. The incision should be kept clean and dry. . The Band-Aids or dressings may be removed the day after surgery. . Should the incision become sore, red, and swollen after the first week, check with your doctor.  Personal hygiene: . Shower the day after your procedure.  Activity and limitations: . Do NOT drive or operate any equipment today. . Do NOT lift anything more than 15 pounds for 2-3 weeks after surgery. . Do NOT rest in bed all day. . Walking is encouraged. Walk each day, starting slowly with 5-minute walks 3 or 4 times a day. Slowly increase the length of your walks. . Walk up and down stairs slowly. . Do NOT do strenuous activities, such as golfing, playing tennis, bowling, running, biking, weight lifting, gardening, mowing, or vacuuming for 2-4 weeks. Ask your doctor when it is okay to start.  Diet: Eat a light meal as desired this evening. You may resume your usual diet tomorrow.  Return to work: This is dependent on the type of work you do. For the most part you can return to a desk job within a week of surgery. If you are more active at work, please discuss this with your doctor.  What to expect after your surgery: You may have a slight burning sensation when you urinate on the first day. You may have a very small amount of blood in the urine. Expect to have a small amount of vaginal discharge/light bleeding for 1-2 weeks. It is not unusual to have abdominal soreness and bruising for up to 2 weeks. You may be tired and need more rest for about 1 week. You may experience shoulder pain for 24-72 hours. Lying flat in bed may relieve it.  Call your doctor for any of the following: . Develop a fever of 100.4 or greater . Inability to urinate 6 hours after discharge from  hospital . Severe pain not relieved by pain medications . Persistent of heavy bleeding at incision site . Redness or swelling around incision site after a week . Increasing nausea or vomiting    Post Anesthesia Home Care Instructions  Activity: Get plenty of rest for the remainder of the day. A responsible individual must stay with you for 24 hours following the procedure.  For the next 24 hours, DO NOT: -Drive a car -Paediatric nurse -Drink alcoholic beverages -Take any medication unless instructed by your physician -Make any legal decisions or sign important papers.  Meals: Start with liquid foods such as gelatin or soup. Progress to regular foods as tolerated. Avoid greasy, spicy, heavy foods. If nausea and/or vomiting occur, drink only clear liquids until the nausea and/or vomiting subsides. Call your physician if vomiting continues.  Special Instructions/Symptoms: Your throat may feel dry or sore from the anesthesia or the breathing tube placed in your throat during surgery. If this causes discomfort, gargle with warm salt water. The discomfort should disappear within 24 hours.  If you had a scopolamine patch placed behind your ear for the management of post- operative nausea and/or vomiting:  1. The medication in the patch is effective for 72 hours, after which it should be removed.  Wrap patch in a tissue and discard in the trash. Wash hands thoroughly with soap and water. 2. You may remove the patch earlier than  72 hours if you experience unpleasant side effects which may include dry mouth, dizziness or visual disturbances. 3. Avoid touching the patch. Wash your hands with soap and water after contact with the patch. 4. Please remove by 09/27/2020

## 2020-09-24 NOTE — Interval H&P Note (Signed)
History and Physical Interval Note:  09/24/2020 8:11 AM  Leah Hayes  has presented today for surgery, with the diagnosis of Post Menopausal Bleeding (N95.0).  The various methods of treatment have been discussed with the patient and family. After consideration of risks, benefits and other options for treatment, the patient has consented to  Procedure(s): XI ROBOTIC [place]. (Bilateral) as a surgical intervention.  The patient's history has been reviewed, patient examined, no change in status, stable for surgery.  I have reviewed the patient's chart and labs.  Questions were answered to the patient's satisfaction.     [name]

## 2020-09-24 NOTE — Op Note (Signed)
Pre Op Dx:  Endometrial hyperplasia without atypia Post Op Dx:  Same as pre-op Procedure:  Robotic Assisted Total Laparoscopic Hysterectomy with bilateral Salpingo-oophorectomy and  Cystoscopy   Surgeon:  Dr. [name] Assistants:  Dr. Christophe Louis Anesthesia:  General   EBL:  75cc  IVF:  900cc UOP:  150cc   Drains:  Foley catheter - removed at end of case Specimen removed:  Uterus, cervix, bilateral fallopian tubes, and ovaries Device(s) implanted: None Case Type:  Clean-contaminated Findings:  Normal-appearing uterus, bilateral fallopian tubes, and ovaries. Moderate amount of filmy pelvic adhesions throughout. Filmy adhesions of the anterior uterus to the anterior abdominal wall and adhesions of the left fallopian tube and ovary to the left pelvic sidewall. There were filmy adhesions of the right ovary to the right pelvic sidewall. Complications: None Indications:  55 y.o. G0 with PMB and endometrial hyperplasia without atypia who desired definitive surgical management and declined ongoing medical management. Description of each procedure:  After informed consent the patient was taken to the operating room and placed in dorsal supine position where general endotracheal anesthesia was administered and found to be adequate.  She was placed in dorsal lithotomy position with her arms tucked.  She was prepped and draped in the usual sterile fashion. A RUMI uterine manipulator with the Koh cup and a Foley catheter were placed.  A timeout was called and the procedure confirmed.    A 33mm supraumbilical incision was made and a trochar was used to enter the abdomen under direct visualization.  Pneumoperitoneum was established and atraumatic entry confirmed. Three additional 72mm ports were placed on either side of the umbilicus and an 10mm port was placed in the left upper quadrant under direct visualization. All port sites were injected with 10cc local anesthetic. The pelvis was bathed in a 60cc  Ropivicaine solution. The findings as above was noted.  The patient was placed in Trendelenburg position and the [employer] robotic device was docked.  Next, attention was turned to the console where the hysterectomy was performed. Lysis of adhesions using electrosurgery was performed to free the uterus from the anterior abdominal wall and the adnexa bilaterally. The right fallopian tube was divided at the mesosalpinx and removed. The uteroovarian anastamosis was divided and the right round ligament was divided.  This process was repeated on the contralateral side.  The anterior leaflet of the peritoneum was divided to create a bladder flap. While creating the bladder flap, there was rupture of what was presumed to be the known cervical cyst. Approximately 200cc of Methylene blue was backfilled into the bladder without any spillage/defects noted. The uterine artery and vein were skeletonized and desiccated superior to the Koh cup.  This process was repeated on the contralateral side. Uterine blanching was observed.  A circumferential colpotomy was created along the ridge of the Koh cup and the uterus was passed off the field.  The vaginal occluder was placed in the vagina to maintain pneumoperitoneum.  The vaginal cuff was then closed with V-loc suture. Hemostasis confirmed. Surgical powder was placed on the vaginal cuff and adnexa bilaterally.  The [employer] Vinc[employer] robotic device was undocked and all ports were visualized.   The pneumoperitoneum was reduced completely with the assistance of two deep breaths and all ports were removed.  The skin was closed with 4-0 Vicryl in subcuticular fashion with skin glue placed atop each port site.   The foley catheter was removed. Cystoscopy was performed and demonstrated intact urothelium throughout the bladder, a  normal-appearing trigone and bilaterally patent ureteral orifices with normal urine jets noted.   The vagina was inspected and there were no vaginal tears noted  and no foreign objects remaining in the vagina. The patient was returned to dorsal supine position, awakened and extubated in the OR having appeared to tolerate the procedure well.  All sponge, needle, and instrument counts were correct x 2 at the end of the case.  Disposition:  PACU  [name], DO

## 2020-09-24 NOTE — Transfer of Care (Signed)
Immediate Anesthesia Transfer of Care Note  Patient: Leah Hayes  Procedure(s) Performed: XI ROBOTIC ASSISTED LAPAROSCOPIC HYSTERECTOMY AND BILATERAL SALPINGO-OOPHERECTOMY. (Bilateral )  Patient Location: PACU  Anesthesia Type:General  Level of Consciousness: awake and patient cooperative  Airway & Oxygen Therapy: Patient Spontanous Breathing and Patient connected to nasal cannula oxygen  Post-op Assessment: Report given to RN and Post -op Vital signs reviewed and stable  Post vital signs: Reviewed and stable  Last Vitals:  Vitals Value Taken Time  BP 141/85 09/24/20 1219  Temp    Pulse 71 09/24/20 1219  Resp    SpO2 99 % 09/24/20 1219  Vitals shown include unvalidated device data.  Last Pain:  Vitals:   09/24/20 0743  TempSrc: Oral  PainSc: 0-No pain         Complications: No complications documented.

## 2020-09-24 NOTE — Anesthesia Procedure Notes (Signed)
Procedure Name: Intubation Date/Time: 09/24/2020 8:38 AM Performed by: Georgeanne Nim, CRNA Pre-anesthesia Checklist: Patient identified, Emergency Drugs available, Suction available, Patient being monitored and Timeout performed Patient Re-evaluated:Patient Re-evaluated prior to induction Oxygen Delivery Method: Circle system utilized Preoxygenation: Pre-oxygenation with 100% oxygen Induction Type: IV induction Ventilation: Mask ventilation without difficulty Laryngoscope Size: Mac and 4 Grade View: Grade I Tube type: Oral Tube size: 7.0 mm Number of attempts: 1 Airway Equipment and Method: Stylet Placement Confirmation: ETT inserted through vocal cords under direct vision,  positive ETCO2,  CO2 detector and breath sounds checked- equal and bilateral Secured at: 20 cm Tube secured with: Tape Dental Injury: Teeth and Oropharynx as per pre-operative assessment

## 2020-09-25 ENCOUNTER — Encounter (HOSPITAL_BASED_OUTPATIENT_CLINIC_OR_DEPARTMENT_OTHER): Payer: Self-pay | Admitting: Obstetrics and Gynecology

## 2020-09-25 DIAGNOSIS — N8500 Endometrial hyperplasia, unspecified: Secondary | ICD-10-CM | POA: Diagnosis not present

## 2020-09-25 MED ORDER — OXYCODONE HCL 5 MG PO TABS
ORAL_TABLET | ORAL | Status: AC
  Filled 2020-09-25: qty 2

## 2020-09-25 MED ORDER — IBUPROFEN 800 MG PO TABS: 800.0000 mg | ORAL_TABLET | Freq: Three times a day (TID) | ORAL | 0 refills | Status: DC | PRN

## 2020-09-25 MED ORDER — IBUPROFEN 800 MG PO TABS
ORAL_TABLET | ORAL | Status: AC
  Filled 2020-09-25: qty 1

## 2020-09-25 MED ORDER — OXYCODONE HCL 5 MG PO TABS: 5.0000 mg | ORAL_TABLET | ORAL | 0 refills | Status: DC | PRN

## 2020-09-25 NOTE — Discharge Summary (Signed)
Physician Discharge Summary  Patient ID: Leah Hayes MRN: [id number] DOB/AGE: 1965-08-13 55 y.o.  Admit date: 09/24/2020 Discharge date: 09/25/2020  Admission Diagnoses: Endometrial hyperplasia without atypia Postmenopausal bleeding Hypertension Obesity  Discharge Diagnoses:  Active Problems:   Endometrial hyperplasia without atypia   Hypertension   Postmenopausal bleeding   Morbid obesity with BMI of 40.0-44.9, adult (HCC)  Procedure(s): Robotic Assisted Total Laparoscopic Hysterectomy with Bilateral Salpingo-oophorectomy and Cystoscopy  Discharged Condition: good  Hospital Course: Patient was admitted on 09/24/20 for the above named procedure. She has had an unremarkable post-operative course. Prior to discharge, she was meeting all post-operative goals - tolerating PO, ambulating, voiding spontaneously, and pain well-controlled. She was discharged home on 09/25/20 in stable condition.  Consults: None  Significant Diagnostic Studies: None  Treatments: surgery: See above  Discharge Exam: Blood pressure (!) 122/57, pulse 95, temperature 98.6 F (37 C), resp. rate 20, height 5\' 7"  (1.702 m), weight 113.5 kg, last menstrual period 07/16/2019, SpO2 100 %. Gen:  NAD, pleasant Cardio:  RRR Lungs:  CTAB, no wheezes/rales/rhonchi Abd: Soft, non-distended, non-tender, laparoscopic port sites with skin glue atop and are clean/dry/intact, LLQ port site with a band-aid atop Ext: No bilateral LE edema, no bilateral calf tenderness  Disposition: Discharge disposition: 01-Home or Self Care       Discharge Instructions    Discharge patient   Complete by: As directed    Discharge disposition: 01-Home or Self Care   Discharge patient date: 09/25/2020     Allergies as of 09/25/2020   No Known Allergies     Medication List    STOP taking these medications   medroxyPROGESTERone 5 MG tablet Commonly known as: PROVERA     TAKE these medications   diclofenac Sodium 1 %  Gel Commonly known as: VOLTAREN Apply topically as needed.   Duexis 800-26.6 MG Tabs Generic drug: Ibuprofen-Famotidine Take by mouth every other day.   ibuprofen 800 MG tablet Commonly known as: ADVIL Take 1 tablet (800 mg total) by mouth every 8 (eight) hours as needed for mild pain or moderate pain.   nystatin powder Commonly known as: MYCOSTATIN/NYSTOP Apply 1 application topically as needed.   Olmesartan-amLODIPine-HCTZ 40-10-25 MG Tabs Take by mouth daily.   Olmesartan-amLODIPine-HCTZ 40-5-25 MG Tabs Take by mouth. Dose changed   oxyCODONE 5 MG immediate release tablet Commonly known as: Oxy IR/ROXICODONE Take 1-2 tablets (5-10 mg total) by mouth every 4 (four) hours as needed for moderate pain.   TYLENOL ARTHRITIS PAIN PO Take 500 mg by mouth as needed.        Signed: [name] 09/25/2020, 9:23 AM

## 2020-09-25 NOTE — Anesthesia Postprocedure Evaluation (Signed)
Anesthesia Post Note  Patient: Leah Hayes  Procedure(s) Performed: XI ROBOTIC ASSISTED LAPAROSCOPIC HYSTERECTOMY AND BILATERAL SALPINGO-OOPHERECTOMY. (Bilateral )     Patient location during evaluation: PACU Anesthesia Type: General Level of consciousness: awake and alert Pain management: pain level controlled Vital Signs Assessment: post-procedure vital signs reviewed and stable Respiratory status: spontaneous breathing, nonlabored ventilation, respiratory function stable and patient connected to nasal cannula oxygen Cardiovascular status: blood pressure returned to baseline and stable Postop Assessment: no apparent nausea or vomiting Anesthetic complications: no   No complications documented.  Last Vitals:  Vitals:   09/25/20 0444 09/25/20 0827  BP: 116/71 (!) 122/57  Pulse: 76 95  Resp:  20  Temp: 36.9 C 37 C  SpO2: 97% 100%    Last Pain:  Vitals:   09/25/20 0827  TempSrc:   PainSc: 0-No pain                 Merlinda Frederick

## 2020-09-26 LAB — SURGICAL PATHOLOGY

## 2020-10-02 ENCOUNTER — Encounter: Payer: Self-pay | Admitting: Gynecologic Oncology

## 2020-10-02 ENCOUNTER — Telehealth: Payer: Self-pay | Admitting: *Deleted

## 2020-10-02 NOTE — Telephone Encounter (Signed)
Spoke with the patient and scheduled a new patient appt for 5/23 with Dr Denman George at 9 am; patient given an arrival time of 8:30 am. Patient given the address and phone number for the clinic; along with the policy for mask and visitors

## 2020-10-06 ENCOUNTER — Other Ambulatory Visit: Payer: Self-pay

## 2020-10-06 ENCOUNTER — Encounter: Payer: Self-pay | Admitting: Gynecologic Oncology

## 2020-10-06 ENCOUNTER — Inpatient Hospital Stay: Payer: 59 | Attending: Gynecologic Oncology | Admitting: Gynecologic Oncology

## 2020-10-06 ENCOUNTER — Inpatient Hospital Stay: Payer: 59

## 2020-10-06 VITALS — BP 134/61 | HR 81 | Temp 96.3°F | Resp 18 | Wt 250.2 lb

## 2020-10-06 DIAGNOSIS — D3912 Neoplasm of uncertain behavior of left ovary: Secondary | ICD-10-CM

## 2020-10-06 DIAGNOSIS — E669 Obesity, unspecified: Secondary | ICD-10-CM | POA: Diagnosis not present

## 2020-10-06 DIAGNOSIS — C562 Malignant neoplasm of left ovary: Secondary | ICD-10-CM

## 2020-10-06 DIAGNOSIS — I10 Essential (primary) hypertension: Secondary | ICD-10-CM | POA: Diagnosis not present

## 2020-10-06 DIAGNOSIS — Z780 Asymptomatic menopausal state: Secondary | ICD-10-CM | POA: Diagnosis not present

## 2020-10-06 NOTE — Patient Instructions (Signed)
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 This is typically not treated with chemotherapy or additional surgery. The recommendation is for a "staging" CT scan to serve as baseline imaging. There will be drawing of tumor markers called Inhibin B and anti-mullerian hormone.  The lab tests will be drawn at 6 monthly intervals for 10 years. You will return to see Dr Denman George or Dr Delora Fuel at 6 monthly intervals for 10 years. Pelvic and abdominal examinations will be performed at those visits.  Dr Serita Grit office can be reached at [phone].

## 2020-10-06 NOTE — Progress Notes (Signed)
Consult Note: Gyn-Onc  Consult was requested by Dr. Delora Fuel for the evaluation of Leah Hayes 55 y.o. female  CC:  Chief Complaint  Patient presents with  . Granulosa cell carcinoma of left ovary [facility])    Assessment/Plan:  Leah. Leah Hayes  is a 55 y.o.  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  I counseled Leah Hayes regarding the natural history and prognosis of early stage granulosa cell tumor.  We discussed the recommended staging procedures for both epithelial and stromal cell tumors and how these differ based on different patterns of disease metastases and natural history.  I explained that I do not recommend repeated surgical staging with lymphadenectomy due to the 1% risk for occult metastatic lymph node disease.  I do recommend staging CT scan of the abdomen and pelvis.  Due to contrast shortage this will be performed without IV contrast.  I explained that we do not order routine staging or screening or surveillance CT scans during monitoring of this cancer.  However we will obtain tumor markers including inhibin B and antimullarian hormone (including today as baseline) at her 6 monthly visits to monitor for evidence of recurrence.  Should these become abnormal, we would initiate CT scan evaluation at that time.  I explained potential patterns of recurrence and symptoms with those develop.  I explained the indolent and potential late recurrences of these malignancies and therefore the need for 10-years of surveillance.  Leah Hayes will return to see me in 6 months time.  We will perform inhibin B and anti--mullerian hormone assessment at that time.  HPI: Leah Hayes is a 55 year old P0 who was seen in consultation at the request of Dr Delora Fuel for evaluation of an incidentally found stage IA adult type well differentiated granulosa cell  tumor.  The patient has a history of lifelong oligomenorrhea.  She was diagnosed with possible PCOS of an earlier age.  She transitioned through menopause in approximately in 2020.  She relocated from [place] to [place] or in 2020 and gradually sought care from a gynecologic provider which was established in November 2021 with Dr. Delora Fuel.  At that visit she noted that she was having a new episode of postmenopausal bleeding.  Dr. Delora Fuel followed this up with a same-day pelvic ultrasound scan (on 03/20/2021) which showed uterus measuring 8 x 4.6 x 4.9 cm with an endometrial thickness of 1 cm.  This ultrasound showed a normal right ovary measuring 2.5 x 2.8 cm and a normal left ovary measuring 2.8 cm.  Due to the thickened endometrium that was observed she underwent an endometrial biopsy on the same day (03/20/2021) which revealed endometrial hyperplasia without atypia.  She was offered multiple options for intervention including progestin therapy which she initiated.  She eventually determined that she desired definitive treatment with hysterectomy and BSO and a scheduled procedure was performed robotically with Dr. Delora Fuel on 09/24/2020.  At that time intraoperative findings from the operative note indicates that the ovaries and tubes were grossly normal as was the uterus.  The peritoneal cavity contained filmy adhesions in the pelvis but no other documented abnormalities.  The procedure was uncomplicated.  Final pathology returned with an incidental finding of a left ovarian adult type granulosa cell tumor measuring 2.5 cm.  The tumor demonstrated no ovarian surface involvement.  The tumor was well differentiated.  The  right tube and ovary were microscopically normal, as was the cervix, endometrium which contained only an endometrial polyp, and myometrium.  She was determined to have clinical stage Ia adult type well differentiated left ovarian granulosa cell tumor and was referred to gynecologic  oncology for consultation.  Her medical history is most significant for obesity and hypertension.  Her surgical history is most significant for robotic hysterectomy, BSO with Dr Delora Fuel in 2022.  Her gynecologic history is remarkable for PCOS and annovulatory cycles. She had never been pregnant without specifically trying.  Her family cancer history is unremarkable.  She works as a Emergency planning/management officer for [employer], [employer]. She lives alone.   Current Meds:  Outpatient Encounter Medications as of 10/06/2020  Medication Sig  . diclofenac Sodium (VOLTAREN) 1 % GEL Apply topically as needed.  Marland Kitchen ibuprofen (ADVIL) 800 MG tablet Take 1 tablet (800 mg total) by mouth every 8 (eight) hours as needed for mild pain or moderate pain.  Marland Kitchen nystatin (MYCOSTATIN/NYSTOP) powder Apply 1 application topically as needed.  . Olmesartan-amLODIPine-HCTZ 40-10-25 MG TABS Take by mouth daily.  Marland Kitchen oxyCODONE (OXY IR/ROXICODONE) 5 MG immediate release tablet Take 1-2 tablets (5-10 mg total) by mouth every 4 (four) hours as needed for moderate pain.  . Acetaminophen (TYLENOL ARTHRITIS PAIN PO) Take 500 mg by mouth as needed. (Patient not taking: Reported on 10/02/2020)  . Ibuprofen-Famotidine 800-26.6 MG TABS Take by mouth every other day. (Patient not taking: Reported on 10/02/2020)  . [DISCONTINUED] Olmesartan-amLODIPine-HCTZ 40-5-25 MG TABS Take by mouth. Dose changed   No facility-administered encounter medications on file as of 10/06/2020.    Allergy: No Known Allergies  Social Hx:   Social History   Socioeconomic History  . Marital status: Single    Spouse name: Not on file  . Number of children: Not on file  . Years of education: Not on file  . Highest education level: Not on file  Occupational History  . Not on file  Tobacco Use  . Smoking status: Never Smoker  . Smokeless tobacco: Never Used  Vaping Use  . Vaping Use: Never used  Substance and Sexual Activity  . Alcohol use: Yes    Comment: occ wine   . Drug use: Never  . Sexual activity: Not Currently    Birth control/protection: Surgical  Other Topics Concern  . Not on file  Social History Narrative   ** Merged History Encounter **       Social Determinants of Health   Financial Resource Strain: Not on file  Food Insecurity: Not on file  Transportation Needs: Not on file  Physical Activity: Not on file  Stress: Not on file  Social Connections: Not on file  Intimate Partner Violence: Not on file    Past Surgical Hx:  Past Surgical History:  Procedure Laterality Date  . NO PAST SURGERIES    . ROBOTIC ASSISTED LAPAROSCOPIC HYSTERECTOMY AND SALPINGECTOMY Bilateral 09/24/2020   Procedure: XI ROBOTIC ASSISTED LAPAROSCOPIC HYSTERECTOMY AND BILATERAL SALPINGO-OOPHERECTOMY.;  Surgeon: [name], DO;  Location: [facility];  Service: Gynecology;  Laterality: Bilateral;    Past Medical Hx:  Past Medical History:  Diagnosis Date  . Arthritis    oa  . Endometrial hyperplasia without atypia   . Hypertension   . PMB (postmenopausal bleeding)   . Wears glasses     Past Gynecological History:   Patient's last menstrual period was 07/16/2019.  Family Hx:  Family History  Problem Relation Age of Onset  . Breast  cancer Neg Hx   . Ovarian cancer Neg Hx   . Uterine cancer Neg Hx   . Colon cancer Neg Hx   . Pancreatic cancer Neg Hx   . Prostate cancer Neg Hx     Review of Systems:  Constitutional  Feels well,   ENT Normal appearing ears and nares bilaterally Skin/Breast  No rash, sores, jaundice, itching, dryness Cardiovascular  No chest pain, shortness of breath, or edema  Pulmonary  No cough or wheeze.  Gastro Intestinal  No nausea, vomitting, or diarrhoea. No bright red blood per rectum, no abdominal pain, change in bowel movement, or constipation.  Genito Urinary  No frequency, urgency, dysuria,  Musculo Skeletal  No myalgia, arthralgia, joint swelling or pain  Neurologic  No weakness,  numbness, change in gait,  Psychology  No depression, anxiety, insomnia.   Vitals:  Blood pressure 134/61, pulse 81, temperature (!) 96.3 F (35.7 C), temperature source Tympanic, resp. rate 18, weight 250 lb 3 oz (113.5 kg), last menstrual period 07/16/2019, SpO2 100 %.  Physical Exam: WD in NAD Neck  Supple NROM, without any enlargements.  Lymph Node Survey No cervical supraclavicular or inguinal adenopathy Cardiovascular  Warm well perfused peripheries Lungs  No increased WOB Skin  No rash/lesions/breakdown  Psychiatry  Alert and oriented to person, place, and time  Abdomen  Normoactive bowel sounds, abdomen soft, non-tender and obese without evidence of hernia. Hematoma underlying LUQ incision site. Back No CVA tenderness Genito Urinary  deferred Rectal  deferred Extremities  No bilateral cyanosis, clubbing or edema.  60 minutes of total time was spent for this patient encounter, including preparation, face-to-face counseling with the patient and coordination of care, review of imaging (results and images), communication with the referring provider and documentation of the encounter.   Thereasa Solo, MD  10/06/2020, 11:53 AM

## 2020-10-08 ENCOUNTER — Encounter (INDEPENDENT_AMBULATORY_CARE_PROVIDER_SITE_OTHER): Payer: Self-pay

## 2020-10-08 LAB — INHIBIN B: Inhibin B: <7 pg/mL (ref 0.0–16.9)

## 2020-10-09 ENCOUNTER — Other Ambulatory Visit: Payer: Self-pay

## 2020-10-09 ENCOUNTER — Ambulatory Visit (HOSPITAL_COMMUNITY)
Admission: RE | Admit: 2020-10-09 | Discharge: 2020-10-09 | Disposition: A | Discharge status: Home / Self Care | Payer: 59 | Source: Ambulatory Visit | Attending: Gynecologic Oncology | Admitting: Gynecologic Oncology

## 2020-10-09 ENCOUNTER — Encounter (HOSPITAL_COMMUNITY): Payer: Self-pay

## 2020-10-09 DIAGNOSIS — C562 Malignant neoplasm of left ovary: Secondary | ICD-10-CM | POA: Diagnosis present

## 2020-10-10 LAB — ANTI MULLERIAN HORMONE: ANTI-MULLERIAN HORMONE (AMH): 0.087 ng/mL

## 2020-10-14 ENCOUNTER — Telehealth: Payer: Self-pay

## 2020-10-14 ENCOUNTER — Other Ambulatory Visit: Payer: Self-pay

## 2020-10-14 DIAGNOSIS — C562 Malignant neoplasm of left ovary: Secondary | ICD-10-CM

## 2020-10-14 NOTE — Telephone Encounter (Signed)
Spoke with Leah Hayes to setup a lab appointment on 11/9 prior to her 11/16 appointment with Dr. Denman George. Patient verbalized understanding and will call with questions.

## 2020-10-14 NOTE — Telephone Encounter (Signed)
Spoke with Leah Hayes regarding her results. Tumor markers are within range. CT showed no masses, enlarged lymph nodes or metastatic disease. Patient will follow up as planned, her next appointment has been scheduled for 11/16. Patient verbalized understanding and will call with any questions or concerns.

## 2020-10-20 ENCOUNTER — Other Ambulatory Visit: Payer: Self-pay

## 2020-10-20 ENCOUNTER — Ambulatory Visit
Admission: RE | Admit: 2020-10-20 | Discharge: 2020-10-20 | Disposition: A | Discharge status: Home / Self Care | Payer: 59 | Source: Ambulatory Visit | Attending: Family Medicine | Admitting: Family Medicine

## 2020-10-20 DIAGNOSIS — Z1231 Encounter for screening mammogram for malignant neoplasm of breast: Secondary | ICD-10-CM

## 2021-02-20 ENCOUNTER — Telehealth: Payer: Self-pay | Admitting: *Deleted

## 2021-02-20 NOTE — Telephone Encounter (Signed)
Called and moved the patient's appt date and time and provider from 11/9 to 10/27 and 111/16 to 11/2

## 2021-03-12 ENCOUNTER — Other Ambulatory Visit: Payer: Self-pay

## 2021-03-12 ENCOUNTER — Inpatient Hospital Stay: Payer: 59 | Attending: Obstetrics & Gynecology

## 2021-03-12 DIAGNOSIS — C642 Malignant neoplasm of left kidney, except renal pelvis: Secondary | ICD-10-CM | POA: Diagnosis not present

## 2021-03-12 DIAGNOSIS — C562 Malignant neoplasm of left ovary: Secondary | ICD-10-CM

## 2021-03-13 LAB — INHIBIN B: Inhibin B: <7 pg/mL (ref 0.0–16.9)

## 2021-03-17 ENCOUNTER — Encounter: Payer: Self-pay | Admitting: Obstetrics & Gynecology

## 2021-03-18 ENCOUNTER — Other Ambulatory Visit: Payer: Self-pay

## 2021-03-18 ENCOUNTER — Inpatient Hospital Stay: Payer: 59 | Attending: Gynecologic Oncology | Admitting: Obstetrics & Gynecology

## 2021-03-18 VITALS — BP 131/63 | HR 86 | Temp 98.4°F | Resp 16 | Wt 252.6 lb

## 2021-03-18 DIAGNOSIS — Z90710 Acquired absence of both cervix and uterus: Secondary | ICD-10-CM | POA: Insufficient documentation

## 2021-03-18 DIAGNOSIS — Z90722 Acquired absence of ovaries, bilateral: Secondary | ICD-10-CM | POA: Insufficient documentation

## 2021-03-18 DIAGNOSIS — D3912 Neoplasm of uncertain behavior of left ovary: Secondary | ICD-10-CM | POA: Diagnosis present

## 2021-03-18 DIAGNOSIS — C562 Malignant neoplasm of left ovary: Secondary | ICD-10-CM

## 2021-03-18 LAB — ANTI MULLERIAN HORMONE: ANTI-MULLERIAN HORMONE (AMH): <0.015 ng/mL

## 2021-03-18 NOTE — Progress Notes (Signed)
Follow Up Note: Gyn-Onc  Leah Hayes 55 y.o. female  CC: F/U visit   HPI: The oncology history was reviewed.  Interval History: A staging CT of the abdomen/pelvis in 5/22 did not show any metastatic disease. An Inhibin B  in 10/22 was < 7.  An AMH is pending.  She denies abdominal distention, pain, weight loss or change in her bowel habits.     Review of Systems  Review of Systems  Constitutional:  Negative for malaise/fatigue and weight loss.  Respiratory:  Negative for shortness of breath and wheezing.   Cardiovascular:  Negative for chest pain and leg swelling.  Gastrointestinal:  Negative for abdominal pain, blood in stool, constipation, nausea and vomiting.  Genitourinary:  Negative for dysuria, frequency, hematuria and urgency.  Musculoskeletal:  Negative for joint pain and myalgias.  Neurological:  Negative for weakness.  Psychiatric/Behavioral:  Negative for depression. The patient does not have insomnia.    Current medications, allergy, social history, past surgical history, past medical history, family history were all reviewed.    Vitals:  BP 131/63 (BP Location: Left Arm, Patient Position: Sitting)   Pulse 86   Temp 98.4 F (36.9 C) (Oral)   Resp 16   Wt 252 lb 9.6 oz (114.6 kg)   LMP 07/16/2019   SpO2 100%   BMI 39.56 kg/m    Physical Exam:  Physical Exam Exam conducted with a chaperone present.  Constitutional:      General: She is not in acute distress. Cardiovascular:     Rate and Rhythm: Normal rate and regular rhythm.  Pulmonary:     Effort: Pulmonary effort is normal.     Breath sounds: Normal breath sounds. No wheezing or rhonchi.  Abdominal:     Palpations: Abdomen is soft.     Tenderness: There is no abdominal tenderness. There is no right CVA tenderness or left CVA tenderness.     Hernia: No hernia is present.  Genitourinary:    General: Hypopigmented patch, keyhole configuration    Urethra: No urethral lesion.     Vagina: No  lesions. No bleeding Musculoskeletal:     Cervical back: Neck supple.     Right lower leg: No edema.     Left lower leg: No edema.  Lymphadenopathy:     Upper Body:     Right upper body: No supraclavicular adenopathy.     Left upper body: No supraclavicular adenopathy.     Lower Body: No right inguinal adenopathy. No left inguinal adenopathy.  Skin:    Findings: No rash.  Neurological:     Mental Status: She is oriented to person, place, and time.   Assessment/Plan: Granulosa-theca cell tumor of left ovary Ms. Leah Hayes  is a 55 y.o.  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  A follow up staging CT did not show any metastatic disease. Negative symptom review, normal exam.  No evidence of recurrence    >continue 6 monthly visits to monitor for evidence of recurrence including assessment of inhibin B and AMH.  Should these become abnormal, we would initiate CT scan evaluation at that time.     Lahoma Crocker, MD

## 2021-03-18 NOTE — Patient Instructions (Signed)
Return in 6 months

## 2021-03-18 NOTE — Assessment & Plan Note (Addendum)
Ms. Leah Hayes  is a 55 y.o.  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  A follow up staging CT did not show any metastatic disease. Negative symptom review, normal exam.  No evidence of recurrence   >continue 6 monthly visits to monitor for evidence of recurrence including assessment of inhibin B and AMH.  Should these become abnormal, we would initiate CT scan evaluation at that time.   Marland Kitchen

## 2021-03-19 ENCOUNTER — Encounter: Payer: Self-pay | Admitting: Obstetrics & Gynecology

## 2021-03-20 ENCOUNTER — Telehealth: Payer: Self-pay

## 2021-03-20 NOTE — Telephone Encounter (Signed)
Told Leah Hayes that the [facility] was stable and within normal limits per Joylene John, NP.

## 2021-03-25 ENCOUNTER — Other Ambulatory Visit: Payer: 59

## 2021-04-01 ENCOUNTER — Ambulatory Visit: Payer: 59 | Admitting: Gynecologic Oncology

## 2021-06-24 DIAGNOSIS — U071 COVID-19: Secondary | ICD-10-CM | POA: Diagnosis not present

## 2021-07-07 DIAGNOSIS — U099 Post covid-19 condition, unspecified: Secondary | ICD-10-CM | POA: Diagnosis not present

## 2021-08-21 ENCOUNTER — Other Ambulatory Visit: Payer: Self-pay | Admitting: Family Medicine

## 2021-08-21 ENCOUNTER — Other Ambulatory Visit: Payer: Self-pay | Admitting: *Deleted

## 2021-08-21 ENCOUNTER — Other Ambulatory Visit: Payer: Self-pay | Admitting: Gynecologic Oncology

## 2021-08-21 DIAGNOSIS — C562 Malignant neoplasm of left ovary: Secondary | ICD-10-CM

## 2021-08-21 DIAGNOSIS — Z1231 Encounter for screening mammogram for malignant neoplasm of breast: Secondary | ICD-10-CM

## 2021-08-21 NOTE — Progress Notes (Signed)
Labs ordered prior to visit

## 2021-08-24 ENCOUNTER — Other Ambulatory Visit: Payer: Self-pay | Admitting: *Deleted

## 2021-08-24 DIAGNOSIS — C562 Malignant neoplasm of left ovary: Secondary | ICD-10-CM

## 2021-08-24 NOTE — Addendum Note (Signed)
Addended by: Elmo Putt R on: 08/24/2021 10:10 AM ? ? Modules accepted: Orders ? ?

## 2021-09-04 ENCOUNTER — Other Ambulatory Visit: Payer: Self-pay

## 2021-09-04 ENCOUNTER — Inpatient Hospital Stay: Payer: BC Managed Care – PPO | Attending: Genetic Counselor

## 2021-09-04 DIAGNOSIS — C562 Malignant neoplasm of left ovary: Secondary | ICD-10-CM | POA: Insufficient documentation

## 2021-09-07 LAB — INHIBIN B: Inhibin B: <7 pg/mL

## 2021-09-11 LAB — ANTI MULLERIAN HORMONE: ANTI-MULLERIAN HORMONE (AMH): <0.015 ng/mL

## 2021-09-14 ENCOUNTER — Telehealth: Payer: Self-pay | Admitting: *Deleted

## 2021-09-14 NOTE — Telephone Encounter (Signed)
Reviewed lab results with pt today. Her AMH is less than 0.015 and stable and Inhibin B is less than 7.0 which is within normal range. Reminded pt of her up coming appointment with Dr.Jackson-Moore. She verbalized understanding.  ?

## 2021-09-18 ENCOUNTER — Encounter: Payer: Self-pay | Admitting: Obstetrics & Gynecology

## 2021-09-23 ENCOUNTER — Ambulatory Visit: Payer: BC Managed Care – PPO

## 2021-09-23 ENCOUNTER — Encounter: Payer: Self-pay | Admitting: Obstetrics & Gynecology

## 2021-09-23 ENCOUNTER — Inpatient Hospital Stay: Payer: BC Managed Care – PPO | Attending: Obstetrics & Gynecology | Admitting: Obstetrics & Gynecology

## 2021-09-23 VITALS — BP 149/61 | HR 71 | Temp 98.4°F | Resp 16 | Ht 67.0 in | Wt 242.0 lb

## 2021-09-23 DIAGNOSIS — C562 Malignant neoplasm of left ovary: Secondary | ICD-10-CM

## 2021-09-23 DIAGNOSIS — Z8543 Personal history of malignant neoplasm of ovary: Secondary | ICD-10-CM | POA: Insufficient documentation

## 2021-09-23 DIAGNOSIS — D3912 Neoplasm of uncertain behavior of left ovary: Secondary | ICD-10-CM

## 2021-09-23 NOTE — Patient Instructions (Signed)
Return in 6 months

## 2021-09-23 NOTE — Assessment & Plan Note (Signed)
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  No evidence of recurrence ? ?>continue q 6 mos surveillance w/serial assessments of inhibin B, AMH. ?

## 2021-09-23 NOTE — Progress Notes (Signed)
Follow Up Note: Gyn-Onc ? ?Leah Hayes 56 y.o. female ? ?CC: She presents for a f/u visit ? ? ?HPI: The oncology history was reviewed. ? ?Interval History:   In 4/23 an AMH was<0.015 and an inhibin B was < 7.  She denies abdominal distention, pain, weight loss or change in her bowel habits.    ? ?Review of Systems  ?Review of Systems  ?Constitutional:  Negative for malaise/fatigue and weight loss.  ?Respiratory:  Negative for shortness of breath and wheezing.   ?Cardiovascular:  Negative for chest pain and leg swelling.  ?Gastrointestinal:  Negative for abdominal pain, blood in stool, constipation, nausea and vomiting.  ?Genitourinary:  Negative for dysuria, frequency, hematuria and urgency.  ?Musculoskeletal:  Negative for joint pain and myalgias.  ?Neurological:  Negative for weakness.  ?Psychiatric/Behavioral:  Negative for depression. The patient does not have insomnia.   ? ?Current medications, allergy, social history, past surgical history, past medical history, family history were all reviewed. ? ? ? ?Vitals:  BP (!) 149/61 (BP Location: Left Arm, Patient Position: Sitting)   Pulse 71   Temp 98.4 ?F (36.9 ?C) (Oral)   Resp 16   Ht '5\' 7"'$  (1.702 m)   Wt 242 lb (109.8 kg)   LMP 07/16/2019   SpO2 100%   BMI 37.90 kg/m?   ? ?Physical Exam:  ?Physical Exam ?Exam conducted with a chaperone present.  ?Constitutional:   ?   General: She is not in acute distress. ?Cardiovascular:  ?   Rate and Rhythm: Normal rate and regular rhythm.  ?Pulmonary:  ?   Effort: Pulmonary effort is normal.  ?   Breath sounds: Normal breath sounds. No wheezing or rhonchi.  ?Abdominal:  ?   Palpations: Abdomen is soft.  ?   Tenderness: There is no abdominal tenderness. There is no right CVA tenderness or left CVA tenderness.  ?   Hernia: No hernia is present.  ?Genitourinary: ?   General: Hypopigmented patch, keyhole configuration ?   Urethra: No urethral lesion.  ?   Vagina: No lesions. No bleeding ?Musculoskeletal:  ?    Cervical back: Neck supple.  ?   Right lower leg: No edema.  ?   Left lower leg: No edema.  ?Lymphadenopathy:  ?   Upper Body:  ?   Right upper body: No supraclavicular adenopathy.  ?   Left upper body: No supraclavicular adenopathy.  ?   Lower Body: No right inguinal adenopathy. No left inguinal adenopathy.  ?Skin: ?   Findings: No rash.  ?Neurological:  ?   Mental Status: She is oriented to person, place, and time.  ? ?Assessment/Plan: Granulosa-theca cell tumor of left ovary ?56 yo with h/o a clinical stage IA adult type, well differentiated granulosa cell tumor of the left ovary ?Negative symptom review, normal exam, normal tumor markers.  No evidence of recurrence ? ?>continue q 6 mos surveillance w/serial assessments of inhibin B, AMH. ? ? ? ?Lahoma Crocker, MD  ?

## 2021-10-21 ENCOUNTER — Ambulatory Visit
Admission: RE | Admit: 2021-10-21 | Discharge: 2021-10-21 | Disposition: A | Discharge status: Home / Self Care | Payer: BC Managed Care – PPO | Source: Ambulatory Visit | Attending: Family Medicine | Admitting: Family Medicine

## 2021-10-21 DIAGNOSIS — Z1231 Encounter for screening mammogram for malignant neoplasm of breast: Secondary | ICD-10-CM | POA: Diagnosis not present

## 2021-11-12 DIAGNOSIS — E782 Mixed hyperlipidemia: Secondary | ICD-10-CM | POA: Diagnosis not present

## 2021-11-12 DIAGNOSIS — I10 Essential (primary) hypertension: Secondary | ICD-10-CM | POA: Diagnosis not present

## 2021-11-12 DIAGNOSIS — Z0000 Encounter for general adult medical examination without abnormal findings: Secondary | ICD-10-CM | POA: Diagnosis not present

## 2021-11-24 IMAGING — MG MM DIGITAL SCREENING BILAT W/ TOMO AND CAD
8 series · 8 of 24 positions shown · non-contrast
Comparison: Previous exam(s).

CLINICAL DATA: Screening.

EXAM:
DIGITAL SCREENING BILATERAL MAMMOGRAM WITH TOMOSYNTHESIS AND CAD
TECHNIQUE: Bilateral screening digital craniocaudal and mediolateral oblique
mammograms were obtained. Bilateral screening digital breast
tomosynthesis was performed. The images were evaluated with
computer-aided detection.

[R CC synth-2D]
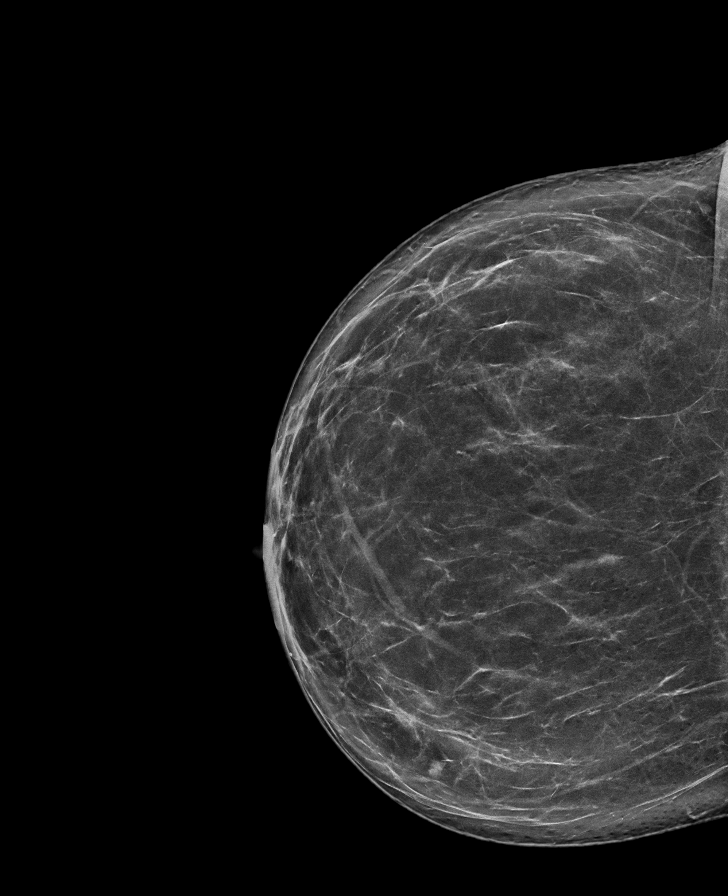

[L CC synth-2D]
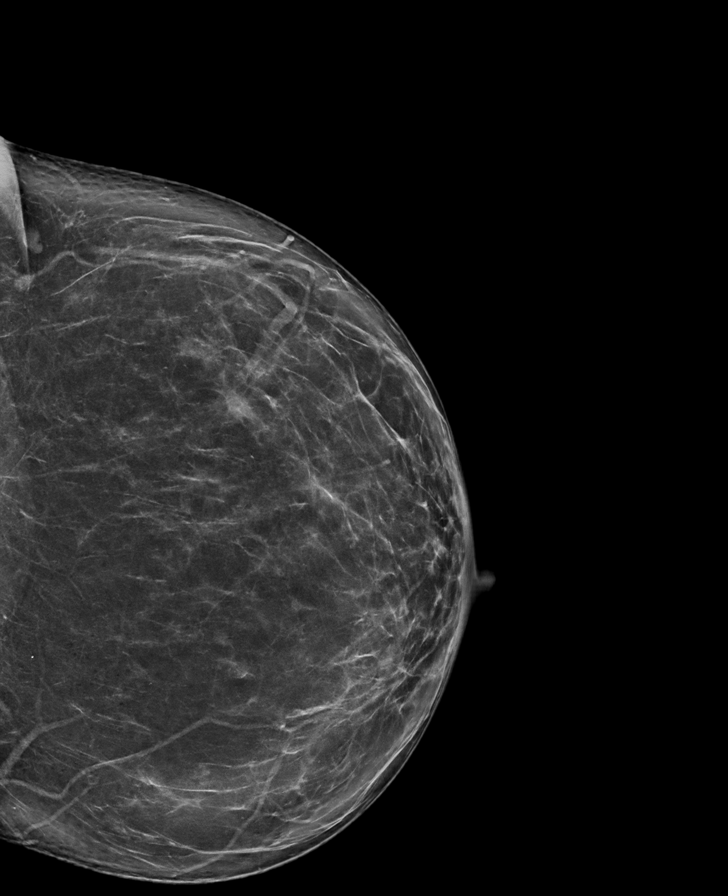

[L MLO synth-2D]
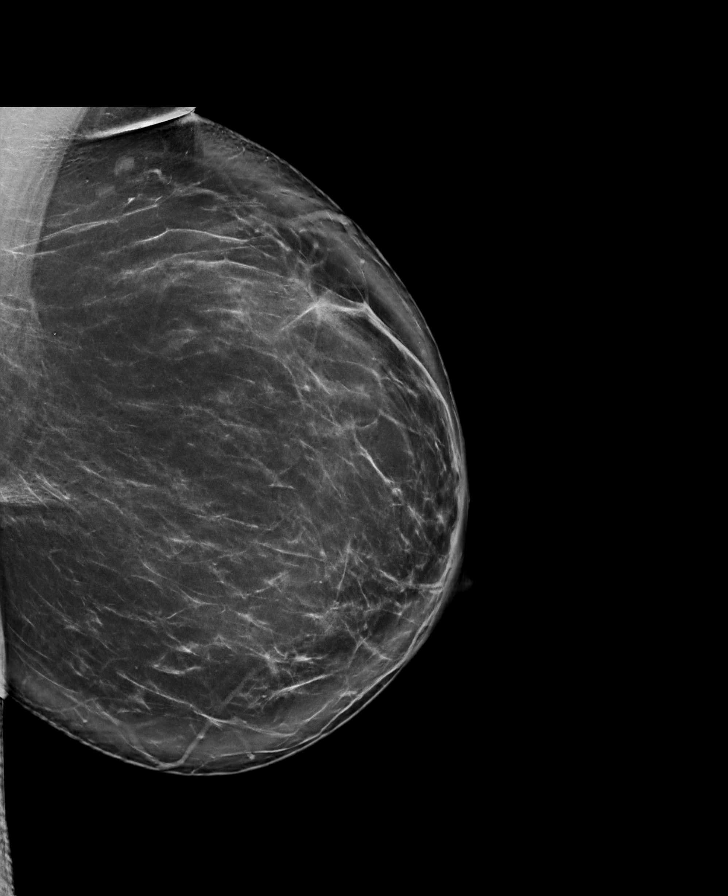

[R MLO synth-2D]
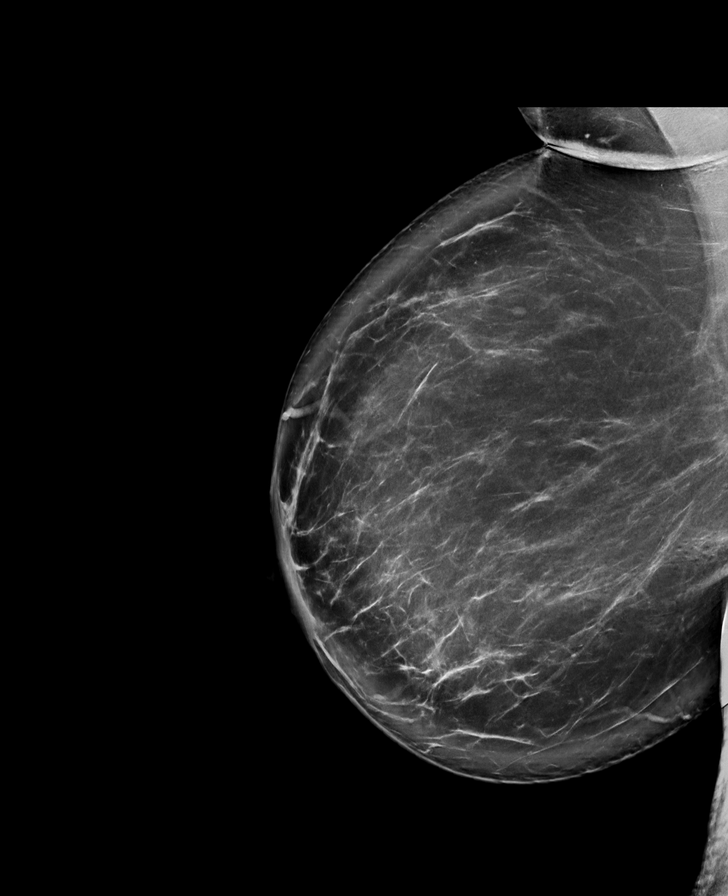

[R MLO tomo · tomo slice 59/116.0]
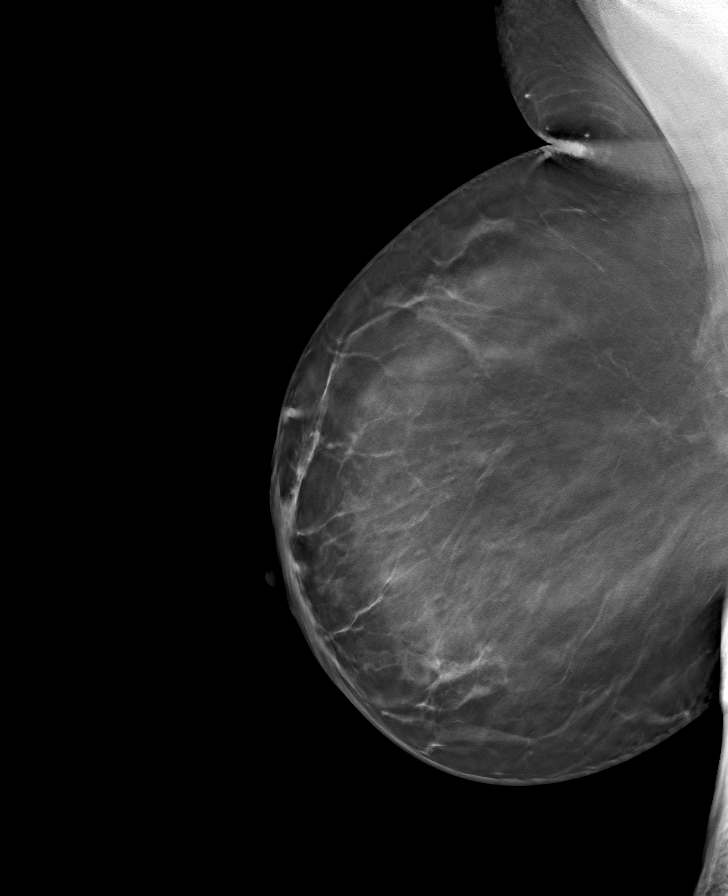

[L MLO tomo · tomo slice 58/115.0]
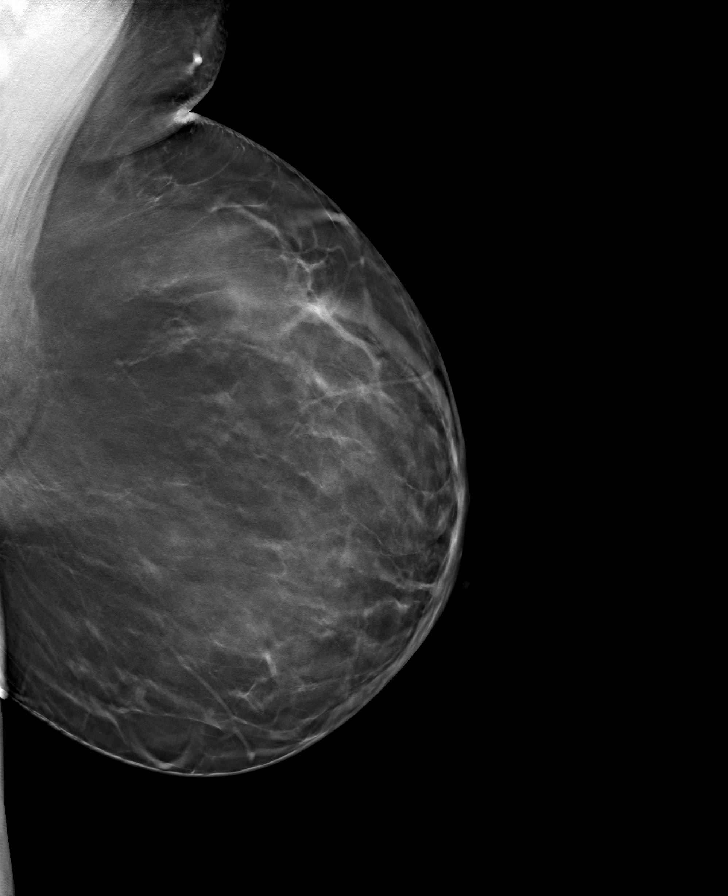

[R CC tomo · tomo slice 47/92.0]
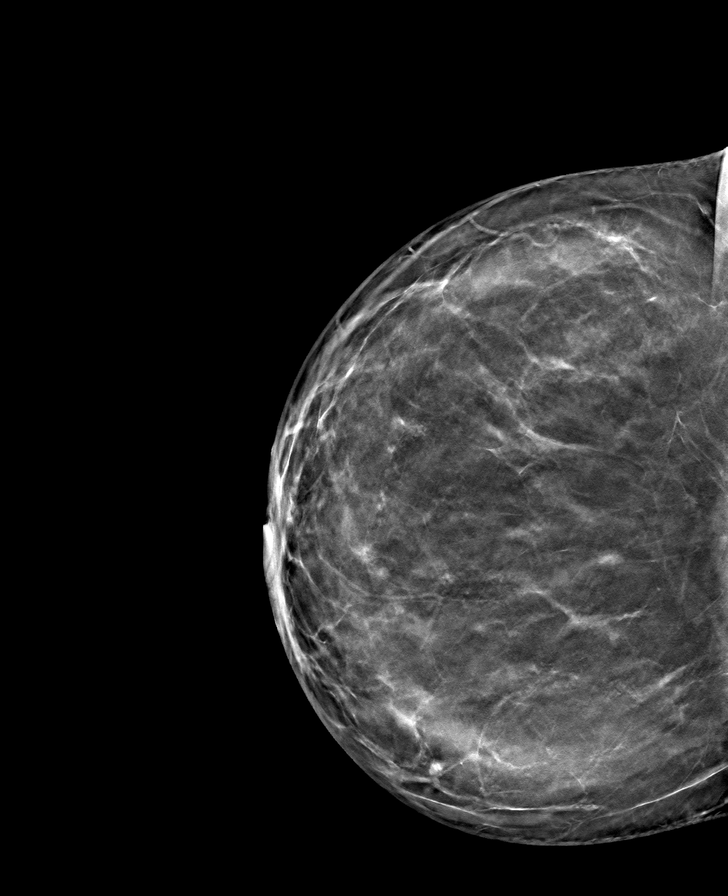

[L CC tomo · tomo slice 47/94.0]
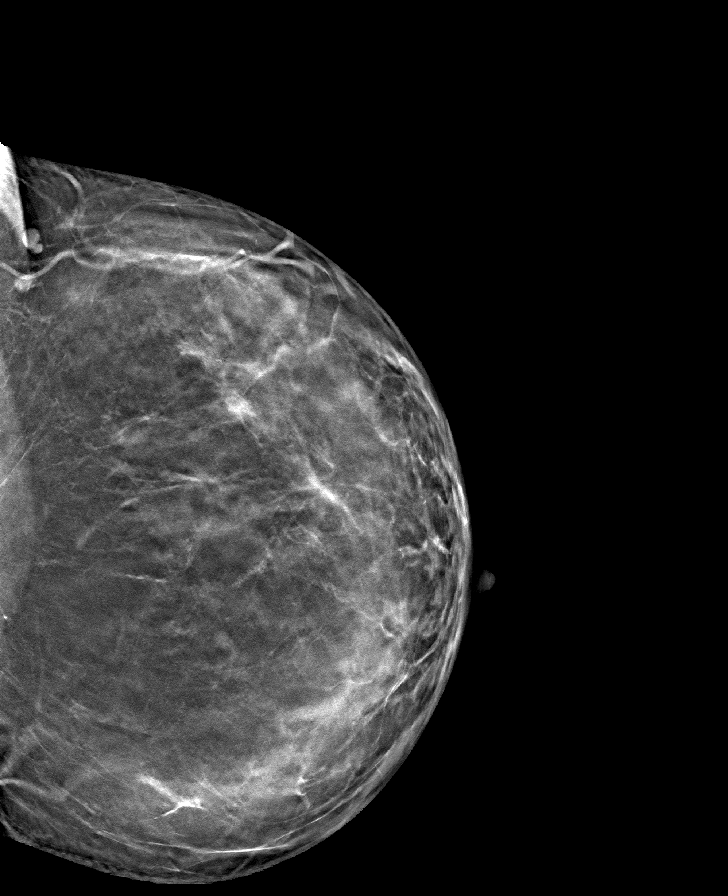

[8 of 24 positions shown; findings below may reference images not displayed]

ACR Breast Density Category b: There are scattered areas of
fibroglandular density.
FINDINGS: There are no findings suspicious for malignancy. The images were
evaluated with computer-aided detection.
IMPRESSION: No mammographic evidence of malignancy. A result letter of this
screening mammogram will be mailed directly to the patient.

RECOMMENDATION:
Screening mammogram in one year. (Code:WJ-I-BG6)

BI-RADS CATEGORY  1: Negative.

## 2021-12-28 DIAGNOSIS — N760 Acute vaginitis: Secondary | ICD-10-CM | POA: Diagnosis not present

## 2022-01-01 DIAGNOSIS — Z01419 Encounter for gynecological examination (general) (routine) without abnormal findings: Secondary | ICD-10-CM | POA: Diagnosis not present

## 2022-03-17 ENCOUNTER — Inpatient Hospital Stay: Payer: BC Managed Care – PPO | Attending: Obstetrics & Gynecology

## 2022-03-17 ENCOUNTER — Other Ambulatory Visit: Payer: Self-pay

## 2022-03-17 DIAGNOSIS — N898 Other specified noninflammatory disorders of vagina: Secondary | ICD-10-CM | POA: Insufficient documentation

## 2022-03-17 DIAGNOSIS — C562 Malignant neoplasm of left ovary: Secondary | ICD-10-CM

## 2022-03-17 DIAGNOSIS — Z8543 Personal history of malignant neoplasm of ovary: Secondary | ICD-10-CM | POA: Diagnosis not present

## 2022-03-19 ENCOUNTER — Inpatient Hospital Stay: Payer: BC Managed Care – PPO

## 2022-03-19 LAB — INHIBIN B: Inhibin B: <7 pg/mL

## 2022-03-22 ENCOUNTER — Telehealth: Payer: Self-pay

## 2022-03-22 LAB — ANTI MULLERIAN HORMONE: ANTI-MULLERIAN HORMONE (AMH): <0.015 ng/mL

## 2022-03-22 NOTE — Telephone Encounter (Signed)
error 

## 2022-03-22 NOTE — Telephone Encounter (Signed)
Pt aware of AMH result being stable and within normal range

## 2022-03-22 NOTE — Telephone Encounter (Signed)
Pt aware of AMH and Inhibin B stable and within normal range

## 2022-03-23 NOTE — Progress Notes (Unsigned)
Follow Up Note: Gyn-Onc  Leah Hayes 56 y.o. female  CC: She presents for a f/u visit   HPI: The oncology history was reviewed.  Interval History:   In 11/23 an AMH was<0.015 and an inhibin B was < 7.  She denies abdominal distention, pain, weight loss or change in her bowel habits. C/O thick, yellow vaginal discharge--no associated sxs.      Review of Systems  Review of Systems  Constitutional:  Negative for malaise/fatigue and weight loss.  Respiratory:  Negative for shortness of breath and wheezing.   Cardiovascular:  Negative for chest pain and leg swelling.  Gastrointestinal:  Negative for abdominal pain, blood in stool, constipation, nausea and vomiting.  Genitourinary:  Negative for dysuria, frequency, hematuria and urgency.  Musculoskeletal:  Negative for joint pain and myalgias.  Neurological:  Negative for weakness.  Psychiatric/Behavioral:  Negative for depression. The patient does not have insomnia.    Current medications, allergy, social history, past surgical history, past medical history, family history were all reviewed.    Vitals:  BP 135/78 (BP Location: Right Arm, Patient Position: Sitting)   Pulse 70   Resp 18   Ht '5\' 6"'$  (1.676 m)   Wt 218 lb (98.9 kg)   LMP 07/16/2019   SpO2 (!) 2%   BMI 35.19 kg/m    Physical Exam:  Physical Exam Exam conducted with a chaperone present.  Constitutional:      General: She is not in acute distress. Cardiovascular:     Rate and Rhythm: Normal rate and regular rhythm.  Pulmonary:     Effort: Pulmonary effort is normal.     Breath sounds: Normal breath sounds. No wheezing or rhonchi.  Abdominal:     Palpations: Abdomen is soft.     Tenderness: There is no abdominal tenderness. There is no right CVA tenderness or left CVA tenderness.     Hernia: No hernia is present.  Genitourinary:    General: Hypopigmented patch, keyhole configuration    Urethra: No urethral lesion.     Vagina: No lesions. No  bleeding Musculoskeletal:     Cervical back: Neck supple.     Right lower leg: No edema.     Left lower leg: No edema.  Lymphadenopathy:     Upper Body:     Right upper body: No supraclavicular adenopathy.     Left upper body: No supraclavicular adenopathy.     Lower Body: No right inguinal adenopathy. No left inguinal adenopathy.  Skin:    Findings: No rash.  Neurological:     Mental Status: She is oriented to person, place, and time.   Assessment/Plan: . 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  No evidence of recurrence  Suspect GSM  >counseled re: moisturizers--education materials provided >continue q 6 mos surveillance w/tumor markers assessments    I personally spent 25 minutes face-to-face and non-face-to-face in the care of this patient, which includes all pre, intra, and post visit time on the date of service.   Lahoma Crocker, MD

## 2022-03-23 NOTE — Assessment & Plan Note (Signed)
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  No evidence of recurrence   >continue q 6 mos surveillance w/tumor markers assessments

## 2022-03-24 ENCOUNTER — Inpatient Hospital Stay (HOSPITAL_BASED_OUTPATIENT_CLINIC_OR_DEPARTMENT_OTHER): Payer: BC Managed Care – PPO | Admitting: Obstetrics & Gynecology

## 2022-03-24 ENCOUNTER — Encounter: Payer: Self-pay | Admitting: Obstetrics & Gynecology

## 2022-03-24 VITALS — BP 135/78 | HR 70 | Resp 18 | Ht 66.0 in | Wt 218.0 lb

## 2022-03-24 DIAGNOSIS — Z8543 Personal history of malignant neoplasm of ovary: Secondary | ICD-10-CM

## 2022-03-24 DIAGNOSIS — N898 Other specified noninflammatory disorders of vagina: Secondary | ICD-10-CM | POA: Diagnosis not present

## 2022-03-24 DIAGNOSIS — D3912 Neoplasm of uncertain behavior of left ovary: Secondary | ICD-10-CM

## 2022-03-24 NOTE — Patient Instructions (Signed)
Return in 6 months

## 2022-04-14 DIAGNOSIS — D3910 Neoplasm of uncertain behavior of unspecified ovary: Secondary | ICD-10-CM | POA: Diagnosis not present

## 2022-04-14 DIAGNOSIS — Z7251 High risk heterosexual behavior: Secondary | ICD-10-CM | POA: Diagnosis not present

## 2022-04-14 DIAGNOSIS — N898 Other specified noninflammatory disorders of vagina: Secondary | ICD-10-CM | POA: Diagnosis not present

## 2022-04-14 DIAGNOSIS — N952 Postmenopausal atrophic vaginitis: Secondary | ICD-10-CM | POA: Diagnosis not present

## 2022-06-02 DIAGNOSIS — E782 Mixed hyperlipidemia: Secondary | ICD-10-CM | POA: Diagnosis not present

## 2022-06-02 DIAGNOSIS — I10 Essential (primary) hypertension: Secondary | ICD-10-CM | POA: Diagnosis not present

## 2022-09-10 ENCOUNTER — Inpatient Hospital Stay: Payer: BLUE CROSS/BLUE SHIELD

## 2022-09-14 ENCOUNTER — Telehealth: Payer: Self-pay | Admitting: Obstetrics and Gynecology

## 2022-09-14 NOTE — Telephone Encounter (Signed)
patient called to verify her insurance isnt active yet she wants to push her appointment out a week.

## 2022-09-15 ENCOUNTER — Inpatient Hospital Stay: Payer: 59 | Attending: Obstetrics & Gynecology

## 2022-09-15 ENCOUNTER — Inpatient Hospital Stay: Payer: BLUE CROSS/BLUE SHIELD | Admitting: Obstetrics & Gynecology

## 2022-09-15 DIAGNOSIS — C562 Malignant neoplasm of left ovary: Secondary | ICD-10-CM | POA: Diagnosis not present

## 2022-09-15 DIAGNOSIS — D3912 Neoplasm of uncertain behavior of left ovary: Secondary | ICD-10-CM

## 2022-09-16 ENCOUNTER — Inpatient Hospital Stay: Payer: BLUE CROSS/BLUE SHIELD

## 2022-09-17 LAB — INHIBIN B: Inhibin B: <7 pg/mL

## 2022-09-21 LAB — ANTI MULLERIAN HORMONE: ANTI-MULLERIAN HORMONE (AMH): <0.015 ng/mL

## 2022-09-23 ENCOUNTER — Inpatient Hospital Stay: Payer: BLUE CROSS/BLUE SHIELD

## 2022-10-18 ENCOUNTER — Encounter: Payer: Self-pay | Admitting: Obstetrics & Gynecology

## 2022-10-19 NOTE — Assessment & Plan Note (Signed)
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  No evidence of recurrence  Suspect GSM   >counseled re: moisturizers--education materials provided >continue q 6 mos surveillance w/tumor markers assessments

## 2022-10-19 NOTE — Progress Notes (Unsigned)
Follow Up Note: Gyn-Onc  Leah Hayes 57 y.o. female  CC: She presents for a f/u visit   HPI: The oncology history was reviewed.  Interval History:   In 5/24 an AMH was<0.015 and an inhibin B was < 7.  She denies abdominal distention, pain, weight loss or change in her bowel habits. C/O thick, yellow vaginal discharge--no associated sxs.      Review of Systems  Review of Systems  Constitutional:  Negative for malaise/fatigue and weight loss.  Respiratory:  Negative for shortness of breath and wheezing.   Cardiovascular:  Negative for chest pain and leg swelling.  Gastrointestinal:  Negative for abdominal pain, blood in stool, constipation, nausea and vomiting.  Genitourinary:  Negative for dysuria, frequency, hematuria and urgency.  Musculoskeletal:  Negative for joint pain and myalgias.  Neurological:  Negative for weakness.  Psychiatric/Behavioral:  Negative for depression. The patient does not have insomnia.    Current medications, allergy, social history, past surgical history, past medical history, family history were all reviewed.    Vitals:  LMP 07/16/2019    Physical Exam:  Physical Exam Exam conducted with a chaperone present.  Constitutional:      General: She is not in acute distress. Cardiovascular:     Rate and Rhythm: Normal rate and regular rhythm.  Pulmonary:     Effort: Pulmonary effort is normal.     Breath sounds: Normal breath sounds. No wheezing or rhonchi.  Abdominal:     Palpations: Abdomen is soft.     Tenderness: There is no abdominal tenderness. There is no right CVA tenderness or left CVA tenderness.     Hernia: No hernia is present.  Genitourinary:    General: Hypopigmented patch, keyhole configuration    Urethra: No urethral lesion.     Vagina: No lesions. No bleeding Musculoskeletal:     Cervical back: Neck supple.     Right lower leg: No edema.     Left lower leg: No edema.  Lymphadenopathy:     Upper Body:     Right upper  body: No supraclavicular adenopathy.     Left upper body: No supraclavicular adenopathy.     Lower Body: No right inguinal adenopathy. No left inguinal adenopathy.  Skin:    Findings: No rash.  Neurological:     Mental Status: She is oriented to person, place, and time.   Assessment/Plan: . No problem-specific Assessment & Plan notes found for this encounter.    I personally spent 25 minutes face-to-face and non-face-to-face in the care of this patient, which includes all pre, intra, and post visit time on the date of service.   Antionette Char, MD

## 2022-10-20 ENCOUNTER — Encounter: Payer: Self-pay | Admitting: Obstetrics & Gynecology

## 2022-10-20 ENCOUNTER — Inpatient Hospital Stay: Payer: 59 | Attending: Obstetrics & Gynecology | Admitting: Obstetrics & Gynecology

## 2022-10-20 ENCOUNTER — Other Ambulatory Visit: Payer: Self-pay

## 2022-10-20 VITALS — BP 149/69 | HR 71 | Temp 98.4°F | Resp 16 | Ht 65.75 in | Wt 236.2 lb

## 2022-10-20 DIAGNOSIS — Z8543 Personal history of malignant neoplasm of ovary: Secondary | ICD-10-CM | POA: Diagnosis not present

## 2022-10-20 DIAGNOSIS — D3912 Neoplasm of uncertain behavior of left ovary: Secondary | ICD-10-CM

## 2022-10-20 NOTE — Patient Instructions (Signed)
Return in 6 months

## 2022-10-26 ENCOUNTER — Other Ambulatory Visit: Payer: Self-pay | Admitting: Family Medicine

## 2022-10-26 DIAGNOSIS — Z1231 Encounter for screening mammogram for malignant neoplasm of breast: Secondary | ICD-10-CM

## 2022-11-05 ENCOUNTER — Ambulatory Visit
Admission: RE | Admit: 2022-11-05 | Discharge: 2022-11-05 | Disposition: A | Discharge status: Home / Self Care | Payer: 59 | Source: Ambulatory Visit | Attending: Family Medicine | Admitting: Family Medicine

## 2022-11-05 DIAGNOSIS — Z1231 Encounter for screening mammogram for malignant neoplasm of breast: Secondary | ICD-10-CM

## 2022-11-19 ENCOUNTER — Telehealth: Payer: Self-pay | Admitting: *Deleted

## 2022-11-19 NOTE — Telephone Encounter (Signed)
Spoke with the patient and scheduled a follow up appt with Dr Tamela Oddi in Nov

## 2023-01-04 ENCOUNTER — Ambulatory Visit (INDEPENDENT_AMBULATORY_CARE_PROVIDER_SITE_OTHER): Payer: 59 | Admitting: Dermatology

## 2023-01-04 ENCOUNTER — Encounter: Payer: Self-pay | Admitting: Dermatology

## 2023-01-04 VITALS — BP 156/102 | HR 64

## 2023-01-04 DIAGNOSIS — L281 Prurigo nodularis: Secondary | ICD-10-CM

## 2023-01-04 DIAGNOSIS — L209 Atopic dermatitis, unspecified: Secondary | ICD-10-CM | POA: Diagnosis not present

## 2023-01-04 DIAGNOSIS — L680 Hirsutism: Secondary | ICD-10-CM

## 2023-01-04 DIAGNOSIS — Z8543 Personal history of malignant neoplasm of ovary: Secondary | ICD-10-CM | POA: Diagnosis not present

## 2023-01-04 DIAGNOSIS — L308 Other specified dermatitis: Secondary | ICD-10-CM

## 2023-01-04 MED ORDER — DUPIXENT 300 MG/2ML ~~LOC~~ SOAJ
300.0000 mg | SUBCUTANEOUS | 11 refills | Status: DC
Start: 2023-01-04 — End: 2023-03-24

## 2023-01-04 MED ORDER — DUPIXENT 300 MG/2ML ~~LOC~~ SOPN
600.0000 mg | PEN_INJECTOR | Freq: Once | SUBCUTANEOUS | 0 refills | Status: AC
Start: 2023-01-04 — End: 2023-01-04

## 2023-01-04 MED ORDER — SAFETY SEAL MISCELLANEOUS MISC: 1.0000 | 4 refills | Status: DC

## 2023-01-04 MED ORDER — TACROLIMUS 0.1 % EX OINT: TOPICAL_OINTMENT | Freq: Two times a day (BID) | CUTANEOUS | 4 refills | Status: DC

## 2023-01-04 MED ORDER — CLOBETASOL PROPIONATE 0.05 % EX CREA: 1.0000 | TOPICAL_CREAM | Freq: Two times a day (BID) | CUTANEOUS | 4 refills | Status: DC

## 2023-01-04 NOTE — Progress Notes (Addendum)
New Patient Visit   Subjective  Leah Hayes is a 57 y.o. female who presents for the following: Atopic Dermatitis and Cyst  Patient states she has Atopic Dermatist located at the scattered throughout her arms and legs that she would like to have examined. Patient reports the areas have been there for  Several  year(s). She reports the areas are bothersome. She states that the areas have spread. Patient reports has previously been treated for these areas.She was previously treated by [facility](Victoria Gross). Patient reports she has had issues with her skin all her life. Her last dermatologist treated the flares with steroid injections to help with the rash. She has also tried steroid topicals that helped some but not completely. Patient also has hx of ovarian cancer but stated it was caught early so she never required any chemo therapy treatment. Patient denied Hx of bx. Patient denies family history of skin cancer(s).    The following portions of the chart were reviewed this encounter and updated as appropriate: medications, allergies, medical history  Review of Systems:  No other skin or systemic complaints except as noted in HPI or Assessment and Plan.  Objective  Well appearing patient in no apparent distress; mood and affect are within normal limits.   A focused examination was performed of the following areas: B/L Arms, B/L Legs and Face  Relevant exam findings are noted in the Assessment and Plan.            Assessment & Plan   ATOPIC DERMATITIS (Eczema) Exam: Scaly papules coalescing to plaques   Flared  Atopic dermatitis (eczema) is a chronic, relapsing, pruritic condition that can significantly affect quality of life. It is often associated with allergic rhinitis and/or asthma and can require treatment with topical medications, phototherapy, or in severe cases biologic injectable medication (Dupixent; Adbry) or Oral JAK inhibitors.  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 Other eczema  Related Medications clobetasol cream (TEMOVATE) 0.05 % Apply 1 Application topically 2 (two) times daily.  tacrolimus (PROTOPIC) 0.1 % ointment Apply topically 2 (two) times daily.  Dupilumab (DUPIXENT) 300 MG/2ML SOPN Inject 600 mg into the skin once for 1 dose. On day 1.  Dupilumab (DUPIXENT) 300 MG/2ML SOPN Inject 300 mg into the skin every 14 (fourteen) days. Starting at day 15 for maintenance.  Safety Seal Miscellaneous MISC Apply 1 Application topically every  other day. Medication Name: Only for her    Return in about 8 weeks (around 03/01/2023) for Hirsutism & Eczema F/U.  Documentation: I have reviewed the above documentation for accuracy and completeness, and I agree with the above.  Stasia Cavalier, am acting as scribe for Langston Reusing, DO.  Langston Reusing, DO

## 2023-01-04 NOTE — Patient Instructions (Addendum)
Hi Leah Hayes,  Thank you for visiting our clinic today. We are committed to assisting you in improving your health and are pleased to have discussed your skin condition and the various treatment options available. Here is a summary of the key instructions and recommendations from today's consultation:  - Topical Treatment Plan For Atopic Dermatitis and Prurigo Nodules on Arms:   - Clobetasol Cream: Apply twice daily for two weeks on itchy areas.   - Tacrolimus Ointment: After the initial two weeks, switch to applying tacrolimus ointment for another two weeks.   - Treatment Cycle: Alternate between clobetasol cream and tacrolimus ointment every two weeks until symptoms improve.  - Dupixent Therapy:   - Insurance Process: We are initiating the process to obtain Dupixent through your insurance, targeting the underlying causes of your skin condition.   - Treatment Initiation: You will start with samples once insurance approval is obtained.  - Facial Skincare:   - Compounded Medication: Begin using a compounded topical medication from [facility], containing spironolactone 3%, tretinoin 0.025%, and clindamycin. Apply every other night, followed by moisturizer.   - Daily Skincare: Continue using your current facial wash and moisturizer from [place]. Apply vitamin C serum in the morning.  - PCOS and Hirsutism Management:   - Oral Spironolactone: Discuss the possibility of starting oral spironolactone with your primary care provider, considering your current medications and medical history.  - Follow-Up Appointments:   - Next Visit: Please schedule a follow-up visit in eight weeks to assess progress and adjust treatments as necessary.   - Primary Care Provider Consultation: Check with your primary care provider regarding the potential addition of spironolactone to your treatment plan.  - Educational Materials:   - Handouts Provided: We have provided handouts on the management of atopic dermatitis  and prurigo nodularis, as well as information on the new skincare regimen.  Please do not hesitate to reach out if you have any questions or concerns regarding your treatment plan. We are here to support you every step of the way.  Warm regards,  Dr. Langston Reusing,  Dermatology   Important Information  Due to recent changes in healthcare laws, you may see results of your pathology and/or laboratory studies on MyChart before the doctors have had a chance to review them. We understand that in some cases there may be results that are confusing or concerning to you. Please understand that not all results are received at the same time and often the doctors may need to interpret multiple results in order to provide you with the best plan of care or course of treatment. Therefore, we ask that you please give [place] 2 business days to thoroughly review all your results before contacting the office for clarification. Should we see a critical lab result, you will be contacted sooner.   If You Need Anything After Your Visit  If you have any questions or concerns for your doctor, please call our main line at [phone] If no one answers, please leave a voicemail as directed and we will return your call as soon as possible. Messages left after 4 pm will be answered the following business day.   You may also send [place] a message via MyChart. We typically respond to MyChart messages within 1-2 business days.  For prescription refills, please ask your pharmacy to contact our office. Our fax number is [phone].  If you have an urgent issue when the clinic is closed that cannot wait until the next business day, you can  page your doctor at the number below.    Please note that while we do our best to be available for urgent issues outside of office hours, we are not available 24/7.   If you have an urgent issue and are unable to reach [place], you may choose to seek medical care at your doctor's office, retail  clinic, urgent care center, or emergency room.  If you have a medical emergency, please immediately call 911 or go to the emergency department. In the event of inclement weather, please call our main line at [phone] for an update on the status of any delays or closures.  Dermatology Medication Tips: Please keep the boxes that topical medications come in in order to help keep track of the instructions about where and how to use these. Pharmacies typically print the medication instructions only on the boxes and not directly on the medication tubes.   If your medication is too expensive, please contact our office at [phone] or send [place] a message through MyChart.   We are unable to tell what your co-pay for medications will be in advance as this is different depending on your insurance coverage. However, we may be able to find a substitute medication at lower cost or fill out paperwork to get insurance to cover a needed medication.   If a prior authorization is required to get your medication covered by your insurance company, please allow [place] 1-2 business days to complete this process.  Drug prices often vary depending on where the prescription is filled and some pharmacies may offer cheaper prices.  The website www.goodrx.com contains coupons for medications through different pharmacies. The prices here do not account for what the cost may be with help from insurance (it may be cheaper with your insurance), but the website can give you the price if you did not use any insurance.  - You can print the associated coupon and take it with your prescription to the pharmacy.  - You may also stop by our office during regular business hours and pick up a GoodRx coupon card.  - If you need your prescription sent electronically to a different pharmacy, notify our office through [facility] or by phone at [phone]

## 2023-01-14 DIAGNOSIS — I10 Essential (primary) hypertension: Secondary | ICD-10-CM | POA: Diagnosis not present

## 2023-01-14 DIAGNOSIS — Z6838 Body mass index (BMI) 38.0-38.9, adult: Secondary | ICD-10-CM | POA: Diagnosis not present

## 2023-01-14 DIAGNOSIS — Z8249 Family history of ischemic heart disease and other diseases of the circulatory system: Secondary | ICD-10-CM | POA: Diagnosis not present

## 2023-01-14 DIAGNOSIS — Z973 Presence of spectacles and contact lenses: Secondary | ICD-10-CM | POA: Diagnosis not present

## 2023-01-14 DIAGNOSIS — Z7982 Long term (current) use of aspirin: Secondary | ICD-10-CM | POA: Diagnosis not present

## 2023-01-14 DIAGNOSIS — Z833 Family history of diabetes mellitus: Secondary | ICD-10-CM | POA: Diagnosis not present

## 2023-01-25 DIAGNOSIS — Z1211 Encounter for screening for malignant neoplasm of colon: Secondary | ICD-10-CM | POA: Diagnosis not present

## 2023-01-25 DIAGNOSIS — Z23 Encounter for immunization: Secondary | ICD-10-CM | POA: Diagnosis not present

## 2023-01-25 DIAGNOSIS — Z124 Encounter for screening for malignant neoplasm of cervix: Secondary | ICD-10-CM | POA: Diagnosis not present

## 2023-01-25 DIAGNOSIS — Z1231 Encounter for screening mammogram for malignant neoplasm of breast: Secondary | ICD-10-CM | POA: Diagnosis not present

## 2023-01-25 DIAGNOSIS — E782 Mixed hyperlipidemia: Secondary | ICD-10-CM | POA: Diagnosis not present

## 2023-01-25 DIAGNOSIS — Z0000 Encounter for general adult medical examination without abnormal findings: Secondary | ICD-10-CM | POA: Diagnosis not present

## 2023-01-25 DIAGNOSIS — I10 Essential (primary) hypertension: Secondary | ICD-10-CM | POA: Diagnosis not present

## 2023-02-15 DIAGNOSIS — D3910 Neoplasm of uncertain behavior of unspecified ovary: Secondary | ICD-10-CM | POA: Diagnosis not present

## 2023-02-15 DIAGNOSIS — Z01419 Encounter for gynecological examination (general) (routine) without abnormal findings: Secondary | ICD-10-CM | POA: Diagnosis not present

## 2023-02-24 ENCOUNTER — Telehealth: Payer: Self-pay | Admitting: *Deleted

## 2023-02-24 NOTE — Telephone Encounter (Signed)
Patient called and scheduled a lab appt for one week before her 11/13 appt

## 2023-03-18 ENCOUNTER — Inpatient Hospital Stay: Payer: 59 | Attending: Obstetrics & Gynecology

## 2023-03-18 DIAGNOSIS — Z8543 Personal history of malignant neoplasm of ovary: Secondary | ICD-10-CM | POA: Insufficient documentation

## 2023-03-18 DIAGNOSIS — D3912 Neoplasm of uncertain behavior of left ovary: Secondary | ICD-10-CM

## 2023-03-21 LAB — INHIBIN B: Inhibin B: <7 pg/mL

## 2023-03-24 ENCOUNTER — Encounter: Payer: Self-pay | Admitting: Obstetrics & Gynecology

## 2023-03-30 ENCOUNTER — Inpatient Hospital Stay (HOSPITAL_BASED_OUTPATIENT_CLINIC_OR_DEPARTMENT_OTHER): Payer: 59 | Admitting: Obstetrics & Gynecology

## 2023-03-30 ENCOUNTER — Encounter: Payer: Self-pay | Admitting: Obstetrics & Gynecology

## 2023-03-30 VITALS — BP 152/90 | HR 61 | Temp 99.1°F | Resp 20 | Wt 251.0 lb

## 2023-03-30 DIAGNOSIS — Z8543 Personal history of malignant neoplasm of ovary: Secondary | ICD-10-CM | POA: Diagnosis not present

## 2023-03-30 DIAGNOSIS — D3912 Neoplasm of uncertain behavior of left ovary: Secondary | ICD-10-CM

## 2023-03-30 LAB — ANTI MULLERIAN HORMONE: ANTI-MULLERIAN HORMONE (AMH): <0.015 ng/mL

## 2023-03-30 NOTE — Progress Notes (Signed)
Follow Up Note: Gyn-Onc  Leah Hayes 57 y.o. female  CC: She presents for a f/u visit   HPI: The oncology history was reviewed.  Interval History:   In 11/24 an AMH is pending and an inhibin B was < 7.  She denies abdominal distention, pain, weight loss or change in her bowel habits. History of Present Illness  She reports a recent episode of severe abdominal pain, which she describes as gas-like. The pain was so severe that she had to lie down. She attributes this episode to eating pretzels, a food she had not consumed before. Since then, she has avoided pretzels and has not experienced a similar episode. She reports regular bowel movements and no other physical symptoms.  The patient also mentions an increase in stress levels due to ongoing unemployment, which has led to an increase in her weight. She acknowledges the need for self-care, including exercise and diet management, to manage her stress levels.  She has been seeing her gynecologist annually and has been discussing the need for continued surveillance due to the type of tumor she had. She understands the need for continued surveillance for up to ten years post-surgery, with tumor markers checked every six months. She expresses a willingness to continue this surveillance regimen.    Review of Systems  Review of Systems  Constitutional:  Negative for malaise/fatigue and weight loss.  Respiratory:  Negative for shortness of breath and wheezing.   Cardiovascular:  Negative for chest pain and leg swelling.  Gastrointestinal:  Negative for abdominal pain, blood in stool, constipation, nausea and vomiting.  Genitourinary:  Negative for dysuria, frequency, hematuria and urgency.  Musculoskeletal:  Negative for joint pain and myalgias.  Neurological:  Negative for weakness.  Psychiatric/Behavioral:  Negative for depression. The patient does not have insomnia.    Current medications, allergy, social history, past surgical  history, past medical history, family history were all reviewed.    Vitals:  LMP 07/16/2019    Physical Exam:  Physical Exam Exam conducted with a chaperone present.  Constitutional:      General: She is not in acute distress. Cardiovascular:     Rate and Rhythm: Normal rate and regular rhythm.  Pulmonary:     Effort: Pulmonary effort is normal.     Breath sounds: Normal breath sounds. No wheezing or rhonchi.  Abdominal:     Palpations: Abdomen is soft.     Tenderness: There is no abdominal tenderness. There is no right CVA tenderness or left CVA tenderness.     Hernia: No hernia is present.  Genitourinary:    General: Hypopigmented patch, keyhole configuration    Urethra: No urethral lesion.     Vagina: No lesions. No bleeding Musculoskeletal:     Cervical back: Neck supple.     Right lower leg: No edema.     Left lower leg: No edema.  Lymphadenopathy:     Upper Body:     Right upper body: No supraclavicular adenopathy.     Left upper body: No supraclavicular adenopathy.     Lower Body: No right inguinal adenopathy. No left inguinal adenopathy.  Skin:    Findings: No rash.  Neurological:     Mental Status: She is oriented to person, place, and time.   Assessment/Plan: .  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  No evidence  of recurrence     >continue q 6 mos surveillance w/tumor markers assessments    I personally spent 25 minutes face-to-face and non-face-to-face in the care of this patient, which includes all pre, intra, and post visit time on the date of service.   Antionette Char, MD

## 2023-03-30 NOTE — Patient Instructions (Signed)
Return in 6 months with labs

## 2023-03-30 NOTE — Assessment & Plan Note (Signed)
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  No evidence of recurrence      >continue q 6 mos surveillance w/tumor markers assessments

## 2023-04-26 DIAGNOSIS — E782 Mixed hyperlipidemia: Secondary | ICD-10-CM | POA: Diagnosis not present

## 2023-04-26 DIAGNOSIS — Z111 Encounter for screening for respiratory tuberculosis: Secondary | ICD-10-CM | POA: Diagnosis not present

## 2023-04-26 DIAGNOSIS — R350 Frequency of micturition: Secondary | ICD-10-CM | POA: Diagnosis not present

## 2023-04-26 DIAGNOSIS — I10 Essential (primary) hypertension: Secondary | ICD-10-CM | POA: Diagnosis not present

## 2023-06-01 DIAGNOSIS — R195 Other fecal abnormalities: Secondary | ICD-10-CM | POA: Diagnosis not present

## 2023-06-01 DIAGNOSIS — R300 Dysuria: Secondary | ICD-10-CM | POA: Diagnosis not present

## 2023-06-21 DIAGNOSIS — E8889 Other specified metabolic disorders: Secondary | ICD-10-CM | POA: Diagnosis not present

## 2023-06-21 DIAGNOSIS — E782 Mixed hyperlipidemia: Secondary | ICD-10-CM | POA: Diagnosis not present

## 2023-06-21 DIAGNOSIS — Z8543 Personal history of malignant neoplasm of ovary: Secondary | ICD-10-CM | POA: Diagnosis not present

## 2023-06-21 DIAGNOSIS — E66813 Obesity, class 3: Secondary | ICD-10-CM | POA: Diagnosis not present

## 2023-06-21 DIAGNOSIS — Z1389 Encounter for screening for other disorder: Secondary | ICD-10-CM | POA: Diagnosis not present

## 2023-06-21 DIAGNOSIS — R194 Change in bowel habit: Secondary | ICD-10-CM | POA: Diagnosis not present

## 2023-06-21 DIAGNOSIS — Z6841 Body Mass Index (BMI) 40.0 and over, adult: Secondary | ICD-10-CM | POA: Diagnosis not present

## 2023-06-21 DIAGNOSIS — I10 Essential (primary) hypertension: Secondary | ICD-10-CM | POA: Diagnosis not present

## 2023-07-05 DIAGNOSIS — F5101 Primary insomnia: Secondary | ICD-10-CM | POA: Diagnosis not present

## 2023-07-05 DIAGNOSIS — Z8543 Personal history of malignant neoplasm of ovary: Secondary | ICD-10-CM | POA: Diagnosis not present

## 2023-07-05 DIAGNOSIS — E8889 Other specified metabolic disorders: Secondary | ICD-10-CM | POA: Diagnosis not present

## 2023-07-05 DIAGNOSIS — E66813 Obesity, class 3: Secondary | ICD-10-CM | POA: Diagnosis not present

## 2023-07-05 DIAGNOSIS — R194 Change in bowel habit: Secondary | ICD-10-CM | POA: Diagnosis not present

## 2023-07-05 DIAGNOSIS — E782 Mixed hyperlipidemia: Secondary | ICD-10-CM | POA: Diagnosis not present

## 2023-07-05 DIAGNOSIS — Z6841 Body Mass Index (BMI) 40.0 and over, adult: Secondary | ICD-10-CM | POA: Diagnosis not present

## 2023-07-05 DIAGNOSIS — I10 Essential (primary) hypertension: Secondary | ICD-10-CM | POA: Diagnosis not present

## 2023-07-20 DIAGNOSIS — E782 Mixed hyperlipidemia: Secondary | ICD-10-CM | POA: Diagnosis not present

## 2023-07-20 DIAGNOSIS — E8889 Other specified metabolic disorders: Secondary | ICD-10-CM | POA: Diagnosis not present

## 2023-07-20 DIAGNOSIS — Z6841 Body Mass Index (BMI) 40.0 and over, adult: Secondary | ICD-10-CM | POA: Diagnosis not present

## 2023-07-20 DIAGNOSIS — E66813 Obesity, class 3: Secondary | ICD-10-CM | POA: Diagnosis not present

## 2023-07-20 DIAGNOSIS — F5101 Primary insomnia: Secondary | ICD-10-CM | POA: Diagnosis not present

## 2023-07-20 DIAGNOSIS — I10 Essential (primary) hypertension: Secondary | ICD-10-CM | POA: Diagnosis not present

## 2023-07-20 DIAGNOSIS — Z8543 Personal history of malignant neoplasm of ovary: Secondary | ICD-10-CM | POA: Diagnosis not present

## 2023-08-02 DIAGNOSIS — F5101 Primary insomnia: Secondary | ICD-10-CM | POA: Diagnosis not present

## 2023-08-02 DIAGNOSIS — Z6841 Body Mass Index (BMI) 40.0 and over, adult: Secondary | ICD-10-CM | POA: Diagnosis not present

## 2023-08-02 DIAGNOSIS — I10 Essential (primary) hypertension: Secondary | ICD-10-CM | POA: Diagnosis not present

## 2023-08-02 DIAGNOSIS — Z8543 Personal history of malignant neoplasm of ovary: Secondary | ICD-10-CM | POA: Diagnosis not present

## 2023-08-02 DIAGNOSIS — E66813 Obesity, class 3: Secondary | ICD-10-CM | POA: Diagnosis not present

## 2023-08-02 DIAGNOSIS — E8889 Other specified metabolic disorders: Secondary | ICD-10-CM | POA: Diagnosis not present

## 2023-08-02 DIAGNOSIS — E782 Mixed hyperlipidemia: Secondary | ICD-10-CM | POA: Diagnosis not present

## 2023-09-06 ENCOUNTER — Telehealth: Payer: Self-pay | Admitting: *Deleted

## 2023-09-06 NOTE — Telephone Encounter (Signed)
 Spoke with patient who called the office to schedule a 6 month follow up with Dr. Gaylin Ke and a lab appt. Pt was given an appt.on Wednesday, May 7 at 1000 with Dr. Gaylin Ke and a lab appt. For Thursday, April 10 th at 0900. Pt agreed to dates and times and had no further questions or concerns.

## 2023-09-07 ENCOUNTER — Other Ambulatory Visit: Payer: Self-pay | Admitting: Gynecologic Oncology

## 2023-09-07 DIAGNOSIS — D3912 Neoplasm of uncertain behavior of left ovary: Secondary | ICD-10-CM

## 2023-09-08 ENCOUNTER — Other Ambulatory Visit: Payer: Self-pay | Admitting: Gynecologic Oncology

## 2023-09-08 ENCOUNTER — Inpatient Hospital Stay: Attending: Obstetrics & Gynecology

## 2023-09-08 DIAGNOSIS — Z8543 Personal history of malignant neoplasm of ovary: Secondary | ICD-10-CM | POA: Diagnosis not present

## 2023-09-08 DIAGNOSIS — C562 Malignant neoplasm of left ovary: Secondary | ICD-10-CM

## 2023-09-08 DIAGNOSIS — D3912 Neoplasm of uncertain behavior of left ovary: Secondary | ICD-10-CM

## 2023-09-13 ENCOUNTER — Encounter: Payer: Self-pay | Admitting: Gynecologic Oncology

## 2023-09-13 LAB — INHIBIN B: Inhibin B: <7 pg/mL

## 2023-09-14 DIAGNOSIS — E66812 Obesity, class 2: Secondary | ICD-10-CM | POA: Diagnosis not present

## 2023-09-14 DIAGNOSIS — Z833 Family history of diabetes mellitus: Secondary | ICD-10-CM | POA: Diagnosis not present

## 2023-09-14 DIAGNOSIS — Z8543 Personal history of malignant neoplasm of ovary: Secondary | ICD-10-CM | POA: Diagnosis not present

## 2023-09-14 DIAGNOSIS — E889 Metabolic disorder, unspecified: Secondary | ICD-10-CM | POA: Diagnosis not present

## 2023-09-14 DIAGNOSIS — E8889 Other specified metabolic disorders: Secondary | ICD-10-CM | POA: Diagnosis not present

## 2023-09-14 DIAGNOSIS — Z6838 Body mass index (BMI) 38.0-38.9, adult: Secondary | ICD-10-CM | POA: Diagnosis not present

## 2023-09-14 DIAGNOSIS — Z8249 Family history of ischemic heart disease and other diseases of the circulatory system: Secondary | ICD-10-CM | POA: Diagnosis not present

## 2023-09-14 DIAGNOSIS — Z6839 Body mass index (BMI) 39.0-39.9, adult: Secondary | ICD-10-CM | POA: Diagnosis not present

## 2023-09-14 DIAGNOSIS — I10 Essential (primary) hypertension: Secondary | ICD-10-CM | POA: Diagnosis not present

## 2023-09-14 DIAGNOSIS — E782 Mixed hyperlipidemia: Secondary | ICD-10-CM | POA: Diagnosis not present

## 2023-09-14 DIAGNOSIS — F5101 Primary insomnia: Secondary | ICD-10-CM | POA: Diagnosis not present

## 2023-09-17 LAB — ANTI MULLERIAN HORMONE: ANTI-MULLERIAN HORMONE (AMH): <0.015 ng/mL

## 2023-09-21 ENCOUNTER — Inpatient Hospital Stay: Attending: Obstetrics & Gynecology | Admitting: Obstetrics & Gynecology

## 2023-09-21 ENCOUNTER — Encounter: Payer: Self-pay | Admitting: Obstetrics & Gynecology

## 2023-09-21 VITALS — BP 122/64 | HR 70 | Temp 98.0°F | Resp 18 | Ht 67.0 in | Wt 251.6 lb

## 2023-09-21 DIAGNOSIS — Z8543 Personal history of malignant neoplasm of ovary: Secondary | ICD-10-CM | POA: Diagnosis not present

## 2023-09-21 DIAGNOSIS — C562 Malignant neoplasm of left ovary: Secondary | ICD-10-CM

## 2023-09-21 DIAGNOSIS — D3912 Neoplasm of uncertain behavior of left ovary: Secondary | ICD-10-CM

## 2023-09-21 NOTE — Progress Notes (Signed)
 Follow Up Note: Gyn-Onc  Leah Hayes 58 y.o. female  CC: She presents for a f/u visit   HPI: The oncology history was reviewed.  Interval History:   In 4/25 an AMH was < 0.015 is pending and an inhibin B was < 7.  She denies abdominal distention, pain.  She reports good energy levels. Bowel and bladder functions are normal. Her weight is stable, with intentional weight loss since she started attending a wellness center.  She participated in a free cancer screening test called [facility], which involved a blood draw to check for early cancer detection markers, and the results were negative.weight loss or change in her bowel habits.   Discussed the use of AI scribe software for clinical note transcription with the patient, who gave verbal consent to proceed.    Review of Systems  Review of Systems  Constitutional:  Negative for malaise/fatigue and weight loss.  Respiratory:  Negative for shortness of breath and wheezing.   Cardiovascular:  Negative for chest pain and leg swelling.  Gastrointestinal:  Negative for abdominal pain, blood in stool, constipation, nausea and vomiting.  Genitourinary:  Negative for dysuria, frequency, hematuria and urgency.  Musculoskeletal:  Negative for joint pain and myalgias.  Neurological:  Negative for weakness.  Psychiatric/Behavioral:  Negative for depression. The patient does not have insomnia.    Current medications, allergy, social history, past surgical history, past medical history, family history were all reviewed.    Vitals:  BP 122/64 (BP Location: Left Arm, Patient Position: Sitting)   Pulse 70   Temp 98 F (36.7 C) (Oral)   Resp 18   Ht 5\' 7"  (1.702 m)   Wt 251 lb 9.6 oz (114.1 kg)   LMP 07/16/2019   SpO2 100%   BMI 39.41 kg/m    Physical Exam:  Physical Exam Exam conducted with a chaperone present.  Constitutional:      General: She is not in acute distress. Cardiovascular:     Rate and Rhythm: Normal rate and regular  rhythm.  Pulmonary:     Effort: Pulmonary effort is normal.     Breath sounds: Normal breath sounds. No wheezing or rhonchi.  Abdominal:     Palpations: Abdomen is soft.     Tenderness: There is no abdominal tenderness. There is no right CVA tenderness or left CVA tenderness.     Hernia: No hernia is present.  Genitourinary:    General: Hypopigmented patch, keyhole configuration    Urethra: No urethral lesion.     Vagina: No lesions. No bleeding Musculoskeletal:     Cervical back: Neck supple.     Right lower leg: No edema.     Left lower leg: No edema.  Lymphadenopathy:     Upper Body:     Right upper body: No supraclavicular adenopathy.     Left upper body: No supraclavicular adenopathy.     Lower Body: No right inguinal adenopathy. No left inguinal adenopathy.  Skin:    Findings: No rash.  Neurological:     Mental Status: She is oriented to person, place, and time.   Assessment/Plan: .  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  No evidence of recurrence     >continue q 6 mos surveillance w/tumor markers assessments    I personally spent 25 minutes face-to-face and non-face-to-face in the care of  this patient, which includes all pre, intra, and post visit time on the date of service.   Abdul Hodgkin, MD

## 2023-09-21 NOTE — Patient Instructions (Signed)
Return as needed or in 6 months 

## 2023-09-21 NOTE — Assessment & Plan Note (Signed)
 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  No evidence of recurrence      >continue q 6 mos surveillance w/tumor markers assessments

## 2023-10-18 ENCOUNTER — Other Ambulatory Visit: Payer: Self-pay | Admitting: Obstetrics and Gynecology

## 2023-10-18 DIAGNOSIS — Z1231 Encounter for screening mammogram for malignant neoplasm of breast: Secondary | ICD-10-CM

## 2023-11-09 ENCOUNTER — Ambulatory Visit

## 2023-11-09 ENCOUNTER — Other Ambulatory Visit: Payer: Self-pay | Admitting: Medical Genetics

## 2023-11-16 ENCOUNTER — Ambulatory Visit
Admission: RE | Admit: 2023-11-16 | Discharge: 2023-11-16 | Disposition: A | Discharge status: Home / Self Care | Source: Ambulatory Visit | Attending: Obstetrics and Gynecology | Admitting: Obstetrics and Gynecology

## 2023-11-16 DIAGNOSIS — Z1231 Encounter for screening mammogram for malignant neoplasm of breast: Secondary | ICD-10-CM | POA: Diagnosis not present

## 2023-12-07 ENCOUNTER — Other Ambulatory Visit (HOSPITAL_COMMUNITY)

## 2023-12-13 ENCOUNTER — Other Ambulatory Visit

## 2023-12-13 DIAGNOSIS — Z006 Encounter for examination for normal comparison and control in clinical research program: Secondary | ICD-10-CM

## 2023-12-24 LAB — GENECONNECT MOLECULAR SCREEN: Genetic Analysis Overall Interpretation: NEGATIVE

## 2024-02-01 DIAGNOSIS — I10 Essential (primary) hypertension: Secondary | ICD-10-CM | POA: Diagnosis not present

## 2024-02-01 DIAGNOSIS — E782 Mixed hyperlipidemia: Secondary | ICD-10-CM | POA: Diagnosis not present

## 2024-02-15 ENCOUNTER — Ambulatory Visit: Admitting: Dermatology

## 2024-02-15 ENCOUNTER — Encounter: Payer: Self-pay | Admitting: Dermatology

## 2024-02-15 VITALS — BP 122/69

## 2024-02-15 DIAGNOSIS — D2330 Other benign neoplasm of skin of unspecified part of face: Secondary | ICD-10-CM

## 2024-02-15 DIAGNOSIS — D2339 Other benign neoplasm of skin of other parts of face: Secondary | ICD-10-CM

## 2024-02-15 NOTE — Patient Instructions (Signed)

## 2024-02-15 NOTE — Progress Notes (Signed)
   Follow-Up Visit   Subjective  Leah Hayes is a 58 y.o. 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 She is here for new issue today.   Spot Check: Pt has a mole at the upper lip that she believes is getting more prominent. She is unsure of how long is has been there but recalls not having in there during high school or college. At times is can be itchy - rating it 3 out of 10.    Are you nursing, pregnant or trying to conceive? No   The following portions of the chart were reviewed this encounter and updated as appropriate: medications, allergies, medical history  Review of Systems:  No other skin or systemic complaints except as noted in HPI or Assessment and Plan.  Objective  Well appearing patient in no apparent distress; mood and affect are within normal limits.   A focused examination was performed of the following areas: R upper lip   Relevant exam findings are noted in the Assessment and Plan.     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 Present since her 79s, it has shown some growth but is not rapidly enlarging. It is occasionally pruritic, without pain or bleeding. The presence of hair suggests benignity, with no clinical suspicion of melanoma. - Monitor annually for changes in size, symptoms, or appearance. - Document current size and appearance with photographs for future comparison. - Offer referral to Dr. Lissandro Montefiore, a facial plastic surgeon, for consultation if she opts for removal. - Advise to report any changes such as chronic pruritus, pain, bleeding, or rapid growth.    No follow-ups on file.   Documentation: I have reviewed the above documentation for accuracy and completeness,  and I agree with the above.  I, Shirron Maranda, CMA, am acting as scribe for [employer], DO.   Delon Lenis, DO

## 2024-03-07 DIAGNOSIS — R0683 Snoring: Secondary | ICD-10-CM | POA: Diagnosis not present

## 2024-03-11 DIAGNOSIS — R0981 Nasal congestion: Secondary | ICD-10-CM | POA: Diagnosis not present

## 2024-03-11 DIAGNOSIS — R051 Acute cough: Secondary | ICD-10-CM | POA: Diagnosis not present

## 2024-03-11 DIAGNOSIS — R0982 Postnasal drip: Secondary | ICD-10-CM | POA: Diagnosis not present

## 2024-03-11 DIAGNOSIS — J060 Acute laryngopharyngitis: Secondary | ICD-10-CM | POA: Diagnosis not present

## 2024-03-12 ENCOUNTER — Telehealth: Payer: Self-pay | Admitting: *Deleted

## 2024-03-12 NOTE — Telephone Encounter (Signed)
 Patient called and scheduled both her lab/MD apapts. Appts scheduled and patient aware

## 2024-03-13 ENCOUNTER — Other Ambulatory Visit: Payer: Self-pay | Admitting: Gynecologic Oncology

## 2024-03-13 DIAGNOSIS — C562 Malignant neoplasm of left ovary: Secondary | ICD-10-CM

## 2024-03-14 ENCOUNTER — Inpatient Hospital Stay: Attending: Internal Medicine

## 2024-03-14 DIAGNOSIS — C641 Malignant neoplasm of right kidney, except renal pelvis: Secondary | ICD-10-CM | POA: Insufficient documentation

## 2024-03-14 DIAGNOSIS — C562 Malignant neoplasm of left ovary: Secondary | ICD-10-CM

## 2024-03-16 ENCOUNTER — Ambulatory Visit: Payer: Self-pay | Admitting: Gynecologic Oncology

## 2024-03-16 LAB — INHIBIN B: Inhibin B: <7 pg/mL

## 2024-03-21 LAB — ANTI MULLERIAN HORMONE: ANTI-MULLERIAN HORMONE (AMH): <0.015 ng/mL

## 2024-03-22 ENCOUNTER — Encounter: Payer: Self-pay | Admitting: Obstetrics & Gynecology

## 2024-03-28 ENCOUNTER — Inpatient Hospital Stay: Attending: Internal Medicine | Admitting: Obstetrics & Gynecology

## 2024-03-28 VITALS — BP 148/68 | HR 72 | Temp 98.9°F | Resp 20 | Wt 252.4 lb

## 2024-03-28 DIAGNOSIS — Z8543 Personal history of malignant neoplasm of ovary: Secondary | ICD-10-CM | POA: Diagnosis not present

## 2024-03-28 DIAGNOSIS — C562 Malignant neoplasm of left ovary: Secondary | ICD-10-CM

## 2024-03-28 DIAGNOSIS — D3912 Neoplasm of uncertain behavior of left ovary: Secondary | ICD-10-CM

## 2024-03-28 NOTE — Assessment & Plan Note (Signed)
 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  No evidence of recurrence      >continue q 6 mos surveillance w/tumor markers assessments

## 2024-03-28 NOTE — Patient Instructions (Signed)
  VISIT SUMMARY: During your routine follow-up visit, we discussed your overall health, including your stable tumor markers. We also talked about your health insurance coverage and the potential changes you might face.  YOUR PLAN: -MALIGNANT NEOPLASM OF LEFT OVARY: Your tumor markers, which help monitor the presence of cancer, are stable and within normal limits. We will continue routine monitoring of these markers. INSTRUCTIONS: Recommend return in 6 months for exam and tumor markers.                      Contains text generated by [employer].                                 Contains text generated by [employer].

## 2024-03-28 NOTE — Progress Notes (Unsigned)
 Follow Up Note: Gyn-Onc  Leah Hayes Billing 58 y.o. female  CC: She presents for a f/u visit   HPI: The oncology history was reviewed.  Interval History:   In 10/25 an AMH was < 0.015 is pending and an inhibin B was < 7.  She denies abdominal distention, pain, weight loss or change in her bowel habits.      Review of Systems  Review of Systems  Constitutional:  Negative for malaise/fatigue and weight loss.  Respiratory:  Negative for shortness of breath and wheezing.   Cardiovascular:  Negative for chest pain and leg swelling.  Gastrointestinal:  Negative for abdominal pain, blood in stool, constipation, nausea and vomiting.  Genitourinary:  Negative for dysuria, frequency, hematuria and urgency.  Musculoskeletal:  Negative for joint pain and myalgias.  Neurological:  Negative for weakness.  Psychiatric/Behavioral:  Negative for depression. The patient does not have insomnia.    Current medications, allergy, social history, past surgical history, past medical history, family history were all reviewed.    Vitals:  LMP 07/16/2019    Physical Exam:  Physical Exam Exam conducted with a chaperone present.  Constitutional:      General: She is not in acute distress. Cardiovascular:     Rate and Rhythm: Normal rate and regular rhythm.  Pulmonary:     Effort: Pulmonary effort is normal.     Breath sounds: Normal breath sounds. No wheezing or rhonchi.  Abdominal:     Palpations: Abdomen is soft.     Tenderness: There is no abdominal tenderness. There is no right CVA tenderness or left CVA tenderness.     Hernia: No hernia is present.  Genitourinary:    General: Hypopigmented patch, keyhole configuration    Urethra: No urethral lesion.     Vagina: No lesions. No bleeding Musculoskeletal:     Cervical back: Neck supple.     Right lower leg: No edema.     Left lower leg: No edema.  Lymphadenopathy:     Upper Body:     Right upper body: No supraclavicular adenopathy.      Left upper body: No supraclavicular adenopathy.     Lower Body: No right inguinal adenopathy. No left inguinal adenopathy.  Skin:    Findings: No rash.  Neurological:     Mental Status: She is oriented to person, place, and time.   Assessment/Plan: .  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  No evidence of recurrence     >continue q 6 mos surveillance w/tumor markers assessments    I personally spent 25 minutes face-to-face and non-face-to-face in the care of this patient, which includes all pre, intra, and post visit time on the date of service.   [place], MD

## 2024-03-30 ENCOUNTER — Encounter: Payer: Self-pay | Admitting: Obstetrics & Gynecology
